# Patient Record
Sex: Female | Born: 1985
Health system: Southern US, Community
[De-identification: ages and names within clinical notes are randomized; demographics above are authoritative.]

## PROBLEM LIST (undated history)

## (undated) DIAGNOSIS — E66811 Obesity, class 1: Secondary | ICD-10-CM

## (undated) DIAGNOSIS — M25561 Pain in right knee: Secondary | ICD-10-CM

## (undated) DIAGNOSIS — I10 Essential (primary) hypertension: Secondary | ICD-10-CM

## (undated) DIAGNOSIS — E669 Obesity, unspecified: Secondary | ICD-10-CM

## (undated) HISTORY — DX: Essential (primary) hypertension: I10

## (undated) HISTORY — DX: Pain in right knee: M25.561

## (undated) HISTORY — DX: Obesity, unspecified: E66.9

## (undated) HISTORY — DX: Obesity, class 1: E66.811

---
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
## 2005-06-05 ENCOUNTER — Ambulatory Visit: Payer: Self-pay | Admitting: Sports Medicine

## 2006-07-06 ENCOUNTER — Emergency Department (HOSPITAL_COMMUNITY): Admission: EM | Admit: 2006-07-06 | Discharge: 2006-07-06 | Payer: Self-pay | Admitting: Family Medicine

## 2006-07-06 IMAGING — CR DG HAND COMPLETE 3+V*L*
3 series · 3 of 3 positions shown · non-contrast
Comparison: none

CLINICAL DATA: Hand pain

LEFT HAND - 3  VIEW:

[view not recorded (1 of 3)]
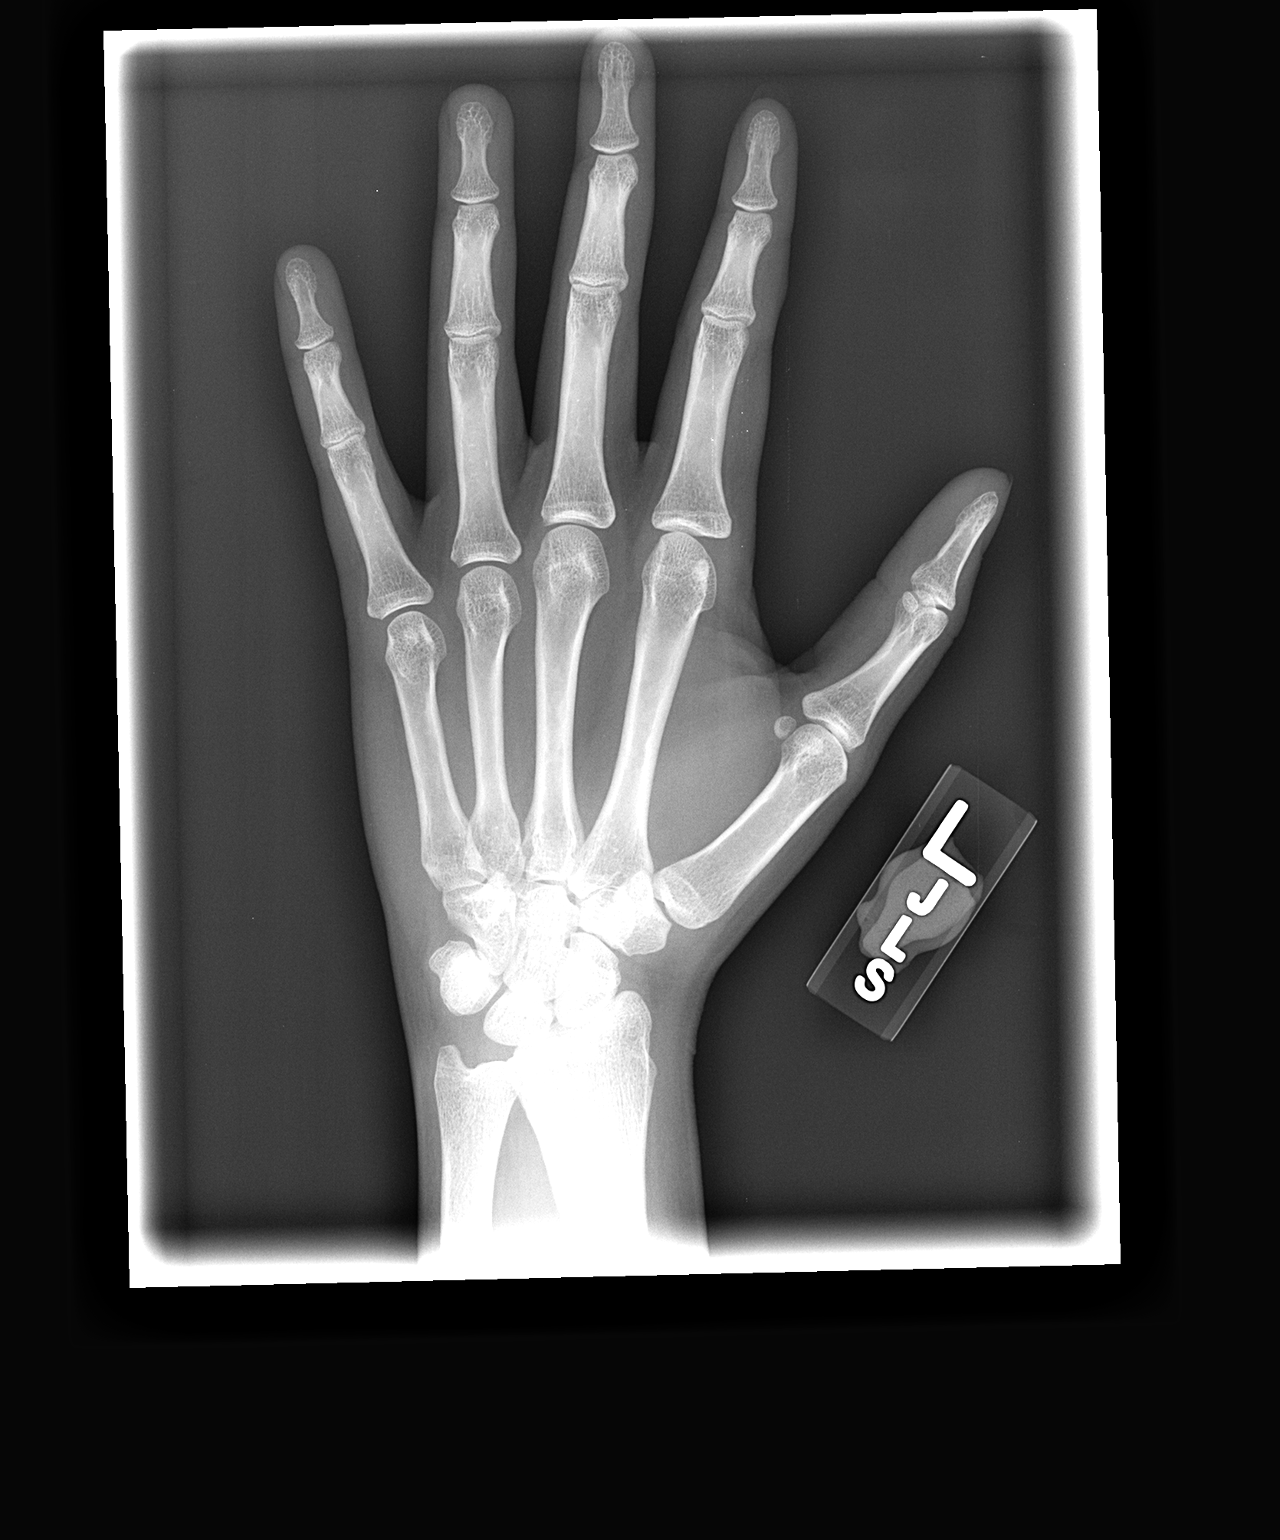

[view not recorded (2 of 3)]
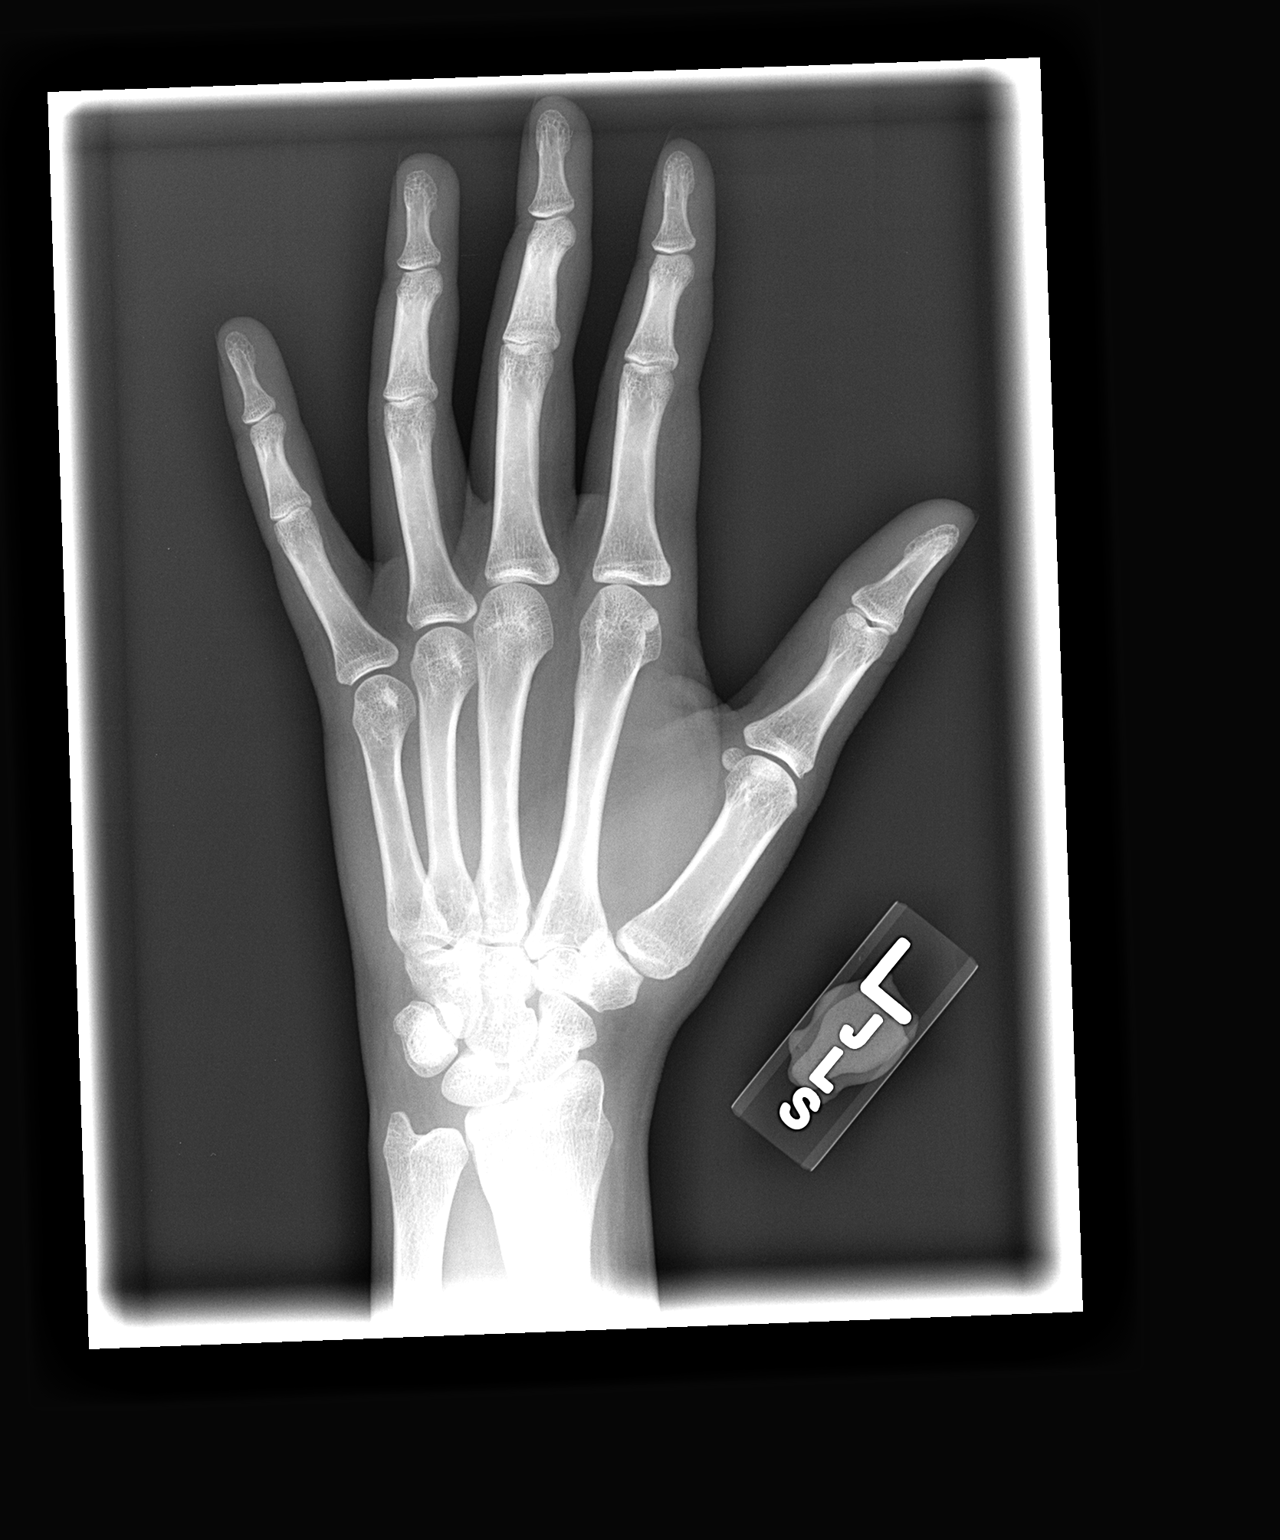

[view not recorded (3 of 3)]
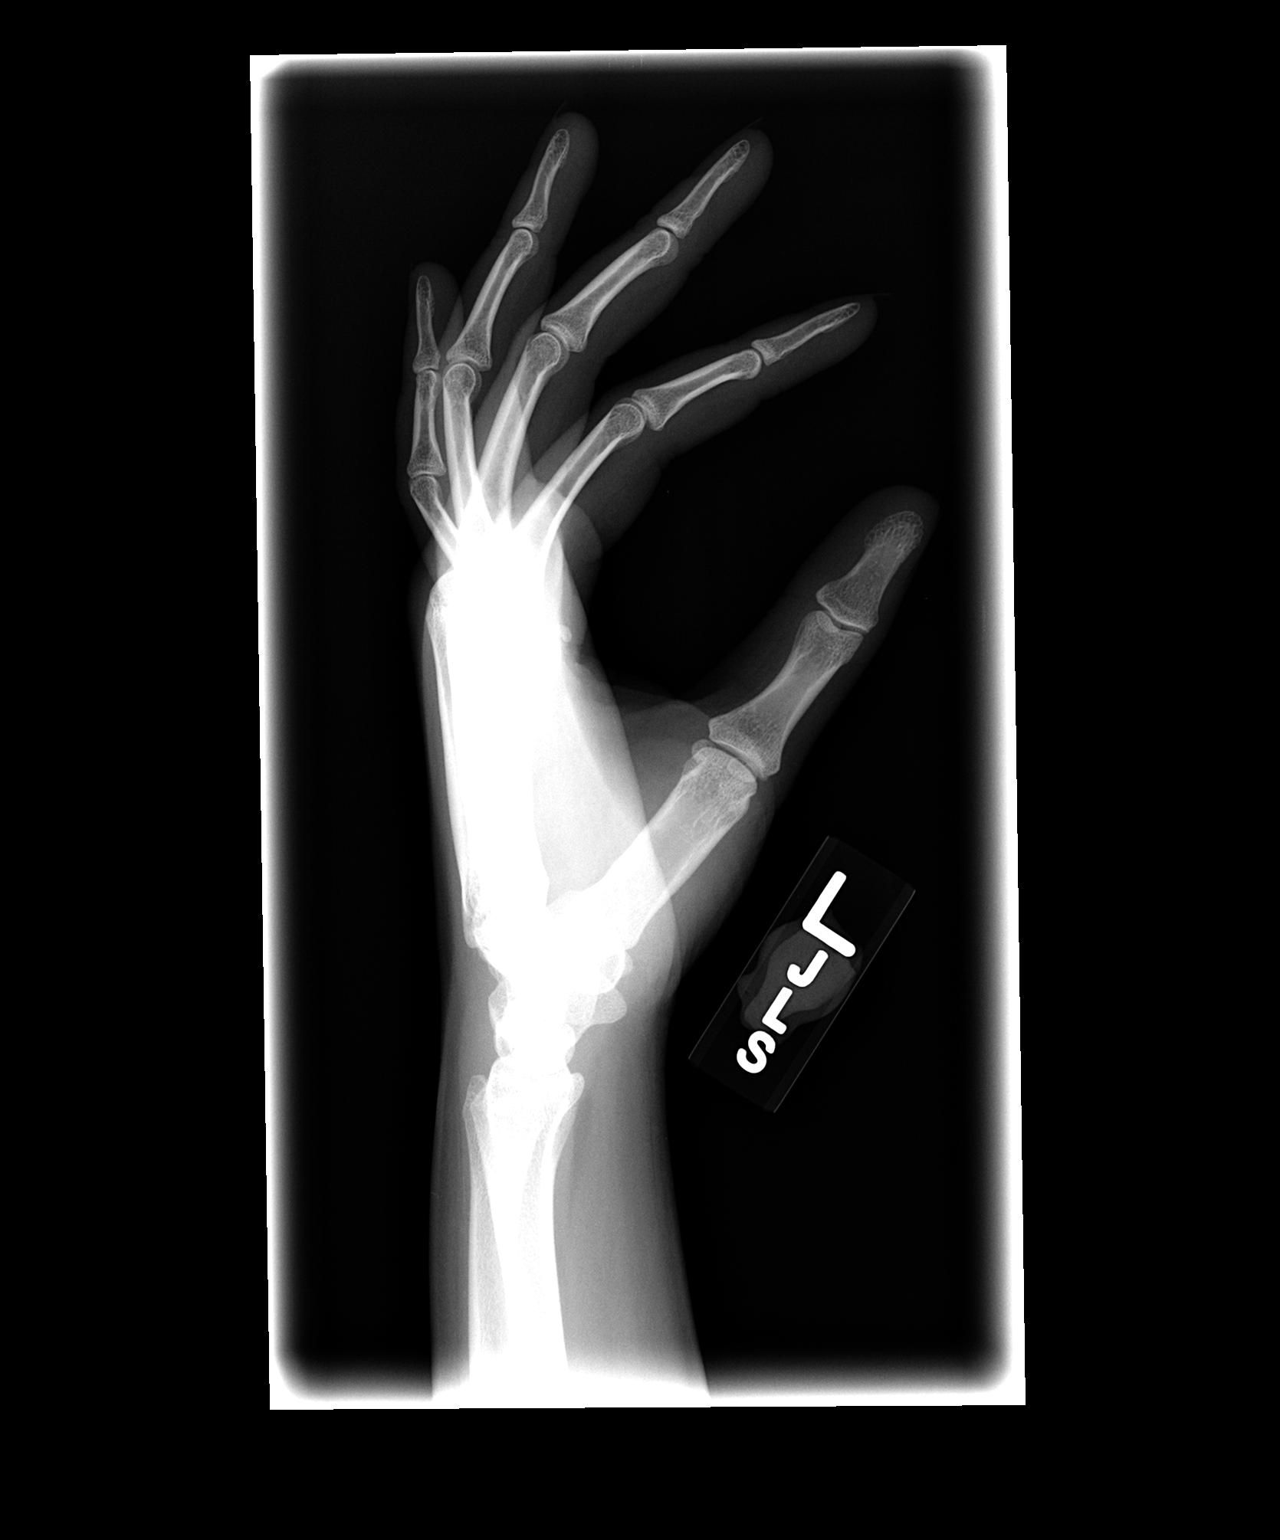

[3 of 3 positions shown; findings below may reference images not displayed]

FINDINGS: There is no evidence of fracture or dislocation.  There is no
evidence of arthropathy or other focal bone abnormality.  Soft tissues are
unremarkable.
IMPRESSION: Negative.

## 2006-07-08 ENCOUNTER — Emergency Department (HOSPITAL_COMMUNITY): Admission: EM | Admit: 2006-07-08 | Discharge: 2006-07-08 | Payer: Self-pay | Admitting: Emergency Medicine

## 2006-07-08 IMAGING — CR DG WRIST COMPLETE 3+V*L*
4 series · 4 of 4 positions shown · non-contrast
Comparison: none

CLINICAL DATA: Injury.  
 LEFT WRIST ? 4 VIEW:

[x wrist pa left]
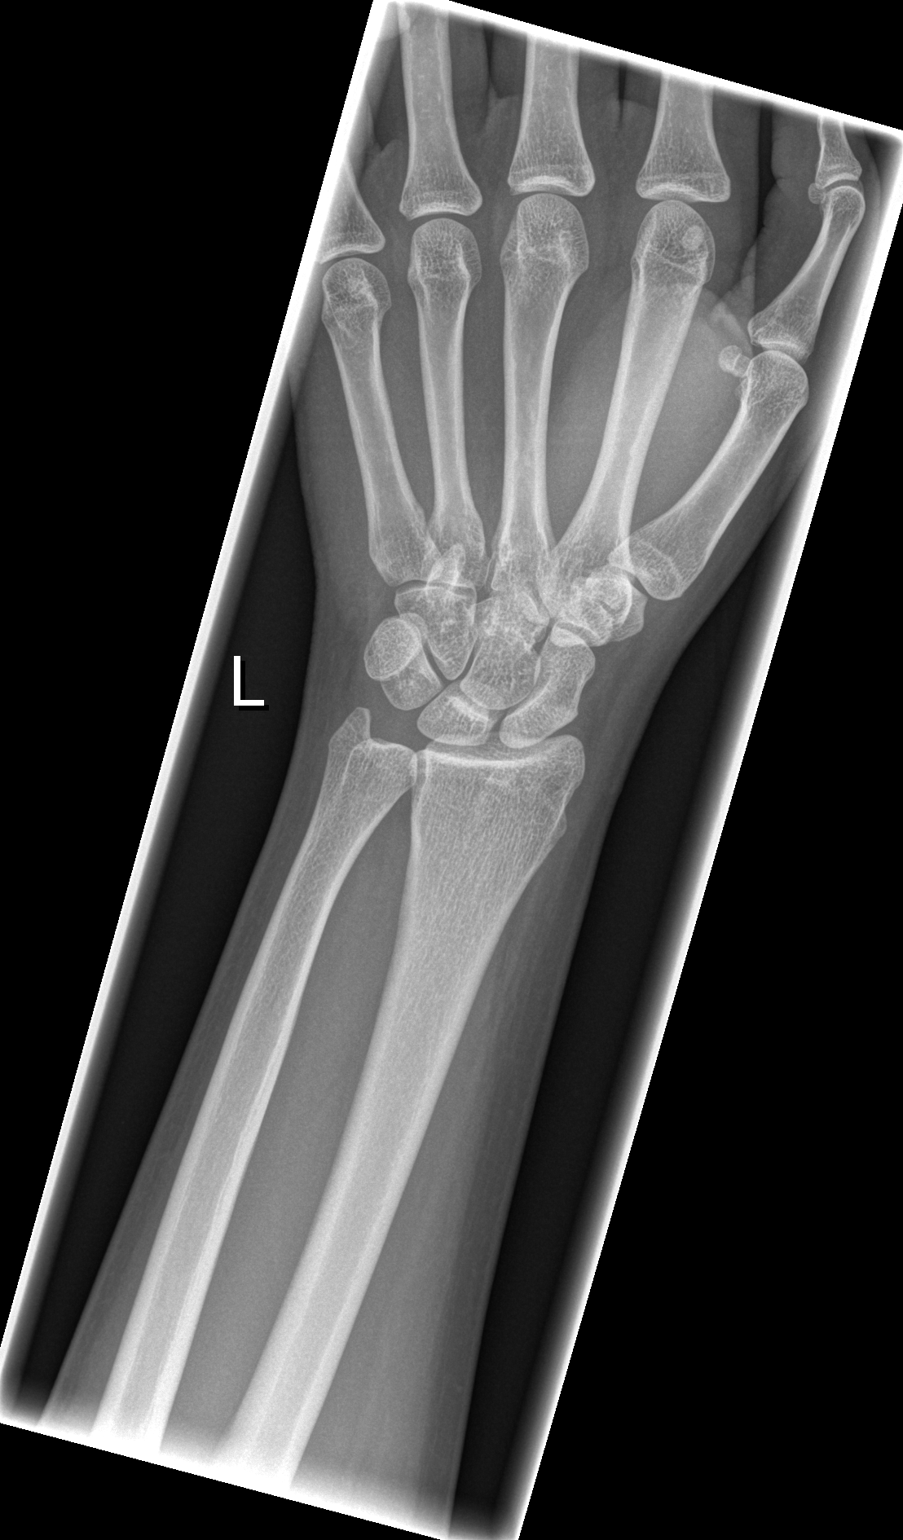

[x wrist obl left]
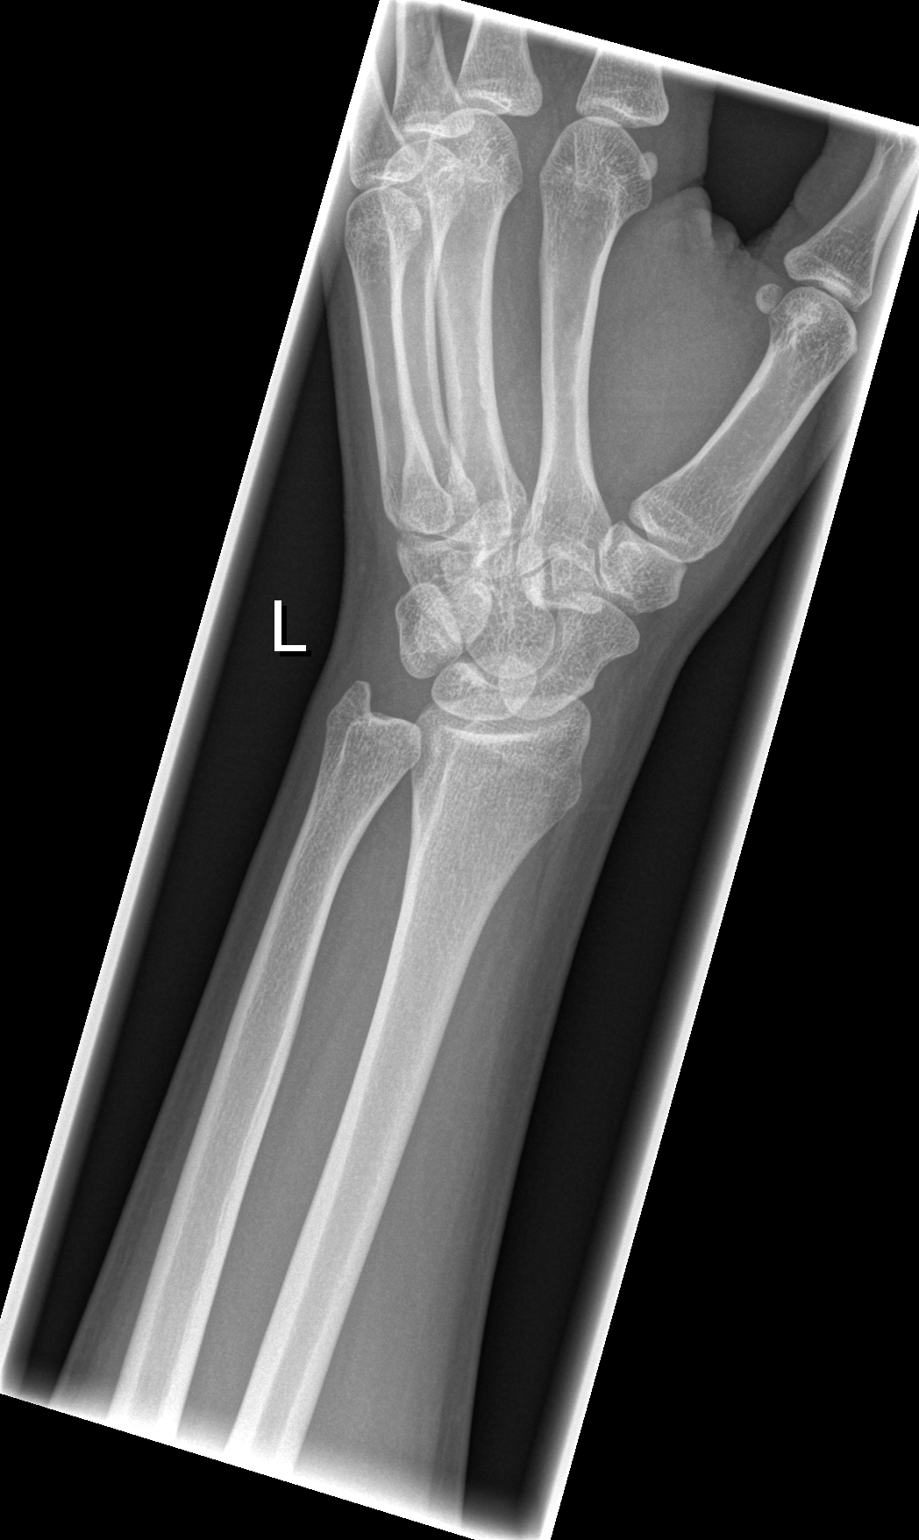

[x wrist lat left]
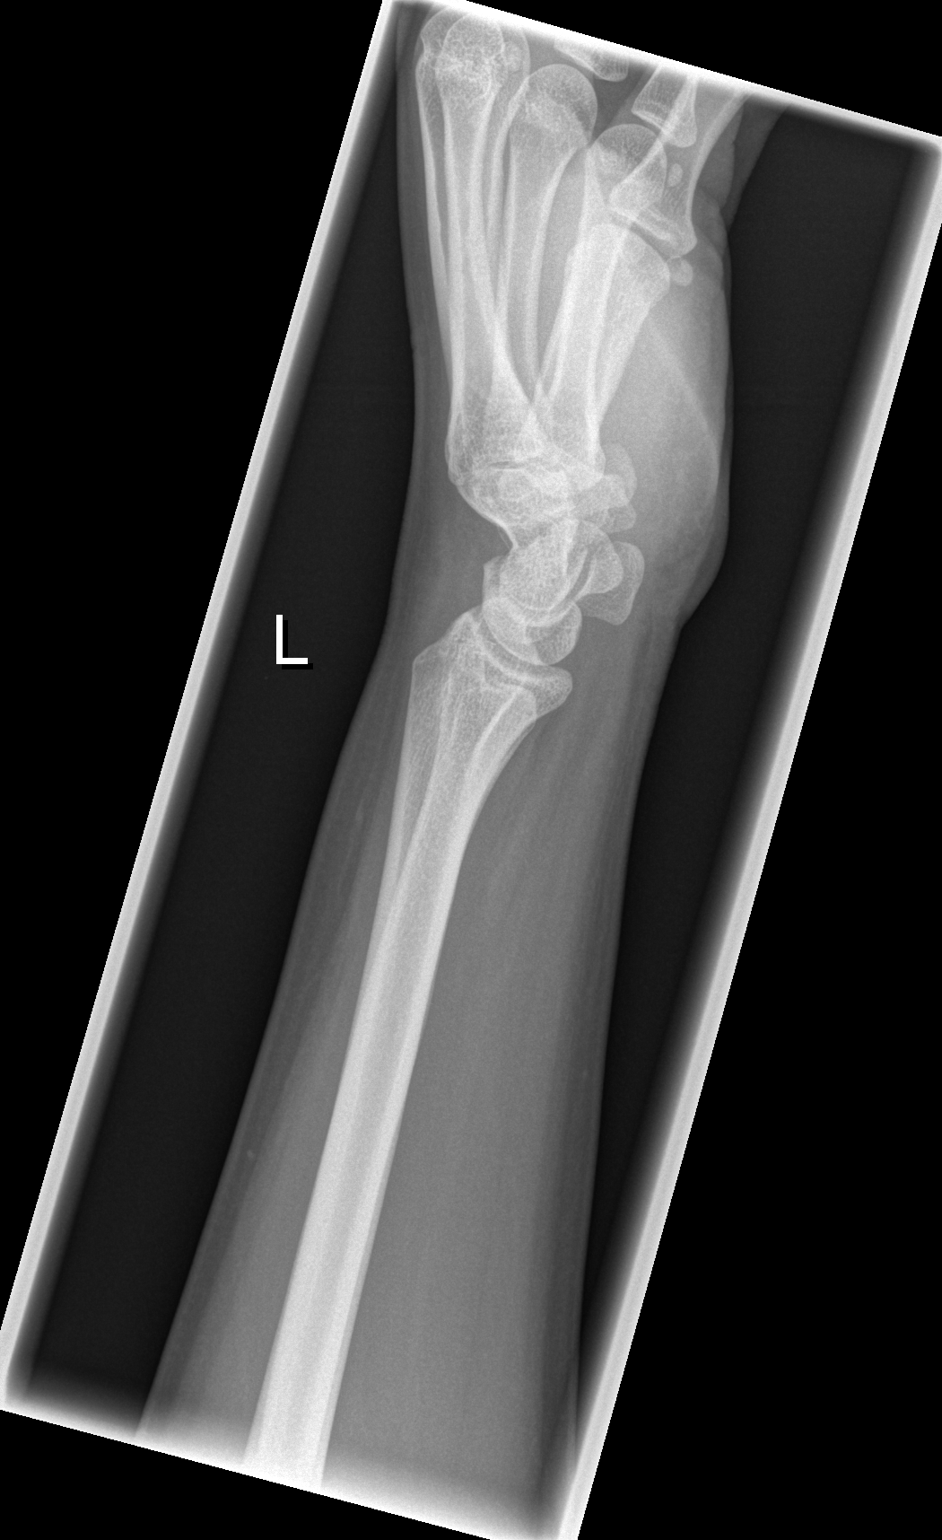

[x wrist navicular]
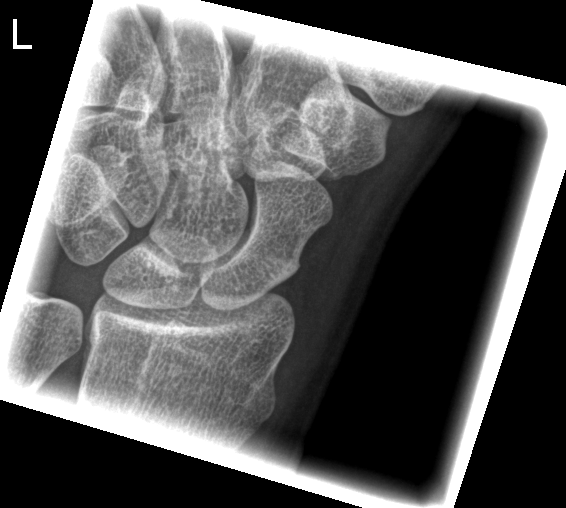

[4 of 4 positions shown; findings below may reference images not displayed]

FINDINGS: There is no evidence of fracture or dislocation.  There is no evidence of arthropathy or other focal bone abnormality.  Soft tissues are unremarkable.
IMPRESSION: Negative.

## 2008-07-15 ENCOUNTER — Ambulatory Visit: Payer: Self-pay | Admitting: Nurse Practitioner

## 2008-07-15 DIAGNOSIS — L259 Unspecified contact dermatitis, unspecified cause: Secondary | ICD-10-CM

## 2008-07-15 DIAGNOSIS — N946 Dysmenorrhea, unspecified: Secondary | ICD-10-CM

## 2008-08-22 ENCOUNTER — Encounter (INDEPENDENT_AMBULATORY_CARE_PROVIDER_SITE_OTHER): Payer: Self-pay | Admitting: Nurse Practitioner

## 2008-08-22 ENCOUNTER — Ambulatory Visit: Payer: Self-pay | Admitting: Nurse Practitioner

## 2008-08-22 LAB — CONVERTED CEMR LAB
ALT: 8 units/L (ref 0–35)
AST: 12 units/L (ref 0–37)
Albumin: 3.9 g/dL (ref 3.5–5.2)
Alkaline Phosphatase: 57 units/L (ref 39–117)
BUN: 9 mg/dL (ref 6–23)
Basophils Absolute: 0 10*3/uL (ref 0.0–0.1)
Basophils Relative: 0 % (ref 0–1)
Beta hcg, urine, semiquantitative: NEGATIVE
Bilirubin Urine: NEGATIVE
Blood in Urine, dipstick: NEGATIVE
CO2: 21 meq/L (ref 19–32)
Calcium: 9.2 mg/dL (ref 8.4–10.5)
Chlamydia, DNA Probe: NEGATIVE
Chloride: 104 meq/L (ref 96–112)
Cholesterol: 124 mg/dL (ref 0–200)
Creatinine, Ser: 0.69 mg/dL (ref 0.40–1.20)
Eosinophils Absolute: 0.1 10*3/uL (ref 0.0–0.7)
Eosinophils Relative: 2 % (ref 0–5)
GC Probe Amp, Genital: NEGATIVE
Glucose, Bld: 79 mg/dL (ref 70–99)
Glucose, Urine, Semiquant: NEGATIVE
HCT: 38.4 % (ref 36.0–46.0)
HDL: 46 mg/dL (ref >39)
Hemoglobin: 11.8 g/dL — ABNORMAL LOW (ref 12.0–15.0)
Ketones, urine, test strip: NEGATIVE
LDL Cholesterol: 71 mg/dL (ref 0–99)
Lymphocytes Relative: 40 % (ref 12–46)
Lymphs Abs: 2 10*3/uL (ref 0.7–4.0)
MCHC: 30.7 g/dL (ref 30.0–36.0)
MCV: 88.5 fL (ref 78.0–100.0)
Monocytes Absolute: 0.6 10*3/uL (ref 0.1–1.0)
Monocytes Relative: 11 % (ref 3–12)
Neutro Abs: 2.4 10*3/uL (ref 1.7–7.7)
Neutrophils Relative %: 47 % (ref 43–77)
Nitrite: NEGATIVE
Platelets: 284 10*3/uL (ref 150–400)
Potassium: 4.2 meq/L (ref 3.5–5.3)
RBC: 4.34 M/uL (ref 3.87–5.11)
RDW: 13.8 % (ref 11.5–15.5)
Sodium: 137 meq/L (ref 135–145)
Specific Gravity, Urine: 1.015
TSH: 0.376 microintl units/mL (ref 0.350–4.500)
Total Bilirubin: 0.3 mg/dL (ref 0.3–1.2)
Total CHOL/HDL Ratio: 2.7
Total Protein: 7.5 g/dL (ref 6.0–8.3)
Triglycerides: 36 mg/dL (ref <150)
Urobilinogen, UA: 0.2
VLDL: 7 mg/dL (ref 0–40)
WBC Urine, dipstick: NEGATIVE
WBC: 5 10*3/uL (ref 4.0–10.5)
pH: 7

## 2008-08-23 ENCOUNTER — Encounter (INDEPENDENT_AMBULATORY_CARE_PROVIDER_SITE_OTHER): Payer: Self-pay | Admitting: Nurse Practitioner

## 2008-11-18 ENCOUNTER — Ambulatory Visit: Payer: Self-pay | Admitting: Nurse Practitioner

## 2008-11-24 ENCOUNTER — Ambulatory Visit: Payer: Self-pay | Admitting: Nurse Practitioner

## 2008-11-25 ENCOUNTER — Ambulatory Visit: Payer: Self-pay | Admitting: Nurse Practitioner

## 2008-11-25 LAB — CONVERTED CEMR LAB: Preg, Serum: NEGATIVE

## 2009-01-23 ENCOUNTER — Telehealth (INDEPENDENT_AMBULATORY_CARE_PROVIDER_SITE_OTHER): Payer: Self-pay | Admitting: Nurse Practitioner

## 2009-02-16 ENCOUNTER — Ambulatory Visit: Payer: Self-pay | Admitting: Nurse Practitioner

## 2009-02-16 ENCOUNTER — Encounter (INDEPENDENT_AMBULATORY_CARE_PROVIDER_SITE_OTHER): Payer: Self-pay | Admitting: *Deleted

## 2009-02-17 ENCOUNTER — Telehealth (INDEPENDENT_AMBULATORY_CARE_PROVIDER_SITE_OTHER): Payer: Self-pay | Admitting: Nurse Practitioner

## 2009-03-22 ENCOUNTER — Ambulatory Visit: Payer: Self-pay | Admitting: Nurse Practitioner

## 2009-03-24 ENCOUNTER — Ambulatory Visit: Payer: Self-pay | Admitting: Nurse Practitioner

## 2009-03-28 ENCOUNTER — Telehealth (INDEPENDENT_AMBULATORY_CARE_PROVIDER_SITE_OTHER): Payer: Self-pay | Admitting: *Deleted

## 2009-03-28 ENCOUNTER — Encounter (INDEPENDENT_AMBULATORY_CARE_PROVIDER_SITE_OTHER): Payer: Self-pay | Admitting: Nurse Practitioner

## 2009-05-09 ENCOUNTER — Ambulatory Visit: Payer: Self-pay | Admitting: Nurse Practitioner

## 2009-07-31 ENCOUNTER — Ambulatory Visit: Payer: Self-pay | Admitting: Nurse Practitioner

## 2009-08-23 ENCOUNTER — Ambulatory Visit: Payer: Self-pay | Admitting: Nurse Practitioner

## 2009-08-23 DIAGNOSIS — M2550 Pain in unspecified joint: Secondary | ICD-10-CM | POA: Insufficient documentation

## 2009-08-23 LAB — CONVERTED CEMR LAB
ALT: 10 units/L (ref 0–35)
AST: 15 units/L (ref 0–37)
Albumin: 4.1 g/dL (ref 3.5–5.2)
BUN: 8 mg/dL (ref 6–23)
Bilirubin Urine: NEGATIVE
CO2: 20 meq/L (ref 19–32)
Calcium: 9.1 mg/dL (ref 8.4–10.5)
Chloride: 106 meq/L (ref 96–112)
Eosinophils Relative: 8 % — ABNORMAL HIGH (ref 0–5)
GC Probe Amp, Genital: NEGATIVE
Glucose, Urine, Semiquant: NEGATIVE
HCT: 40.3 % (ref 36.0–46.0)
Hemoglobin: 12.8 g/dL (ref 12.0–15.0)
KOH Prep: NEGATIVE
Ketones, urine, test strip: NEGATIVE
Lymphocytes Relative: 40 % (ref 12–46)
Lymphs Abs: 1.6 10*3/uL (ref 0.7–4.0)
Monocytes Relative: 7 % (ref 3–12)
Nitrite: NEGATIVE
Platelets: 219 10*3/uL (ref 150–400)
Potassium: 4.1 meq/L (ref 3.5–5.3)
Protein, U semiquant: NEGATIVE
RBC: 4.61 M/uL (ref 3.87–5.11)
Specific Gravity, Urine: >=1.03
Urobilinogen, UA: 0.2
WBC Urine, dipstick: NEGATIVE
WBC: 4 10*3/uL (ref 4.0–10.5)
pH: 5

## 2009-08-25 ENCOUNTER — Encounter (INDEPENDENT_AMBULATORY_CARE_PROVIDER_SITE_OTHER): Payer: Self-pay | Admitting: Nurse Practitioner

## 2009-10-23 ENCOUNTER — Ambulatory Visit: Payer: Self-pay | Admitting: Nurse Practitioner

## 2009-10-24 ENCOUNTER — Ambulatory Visit: Payer: Self-pay | Admitting: Nurse Practitioner

## 2010-01-03 ENCOUNTER — Emergency Department (HOSPITAL_COMMUNITY): Admission: EM | Admit: 2010-01-03 | Discharge: 2010-01-03 | Payer: Self-pay | Admitting: Family Medicine

## 2010-01-16 ENCOUNTER — Ambulatory Visit: Payer: Self-pay | Admitting: Nurse Practitioner

## 2010-04-09 ENCOUNTER — Ambulatory Visit
Admission: RE | Admit: 2010-04-09 | Discharge: 2010-04-09 | Payer: Self-pay | Source: Home / Self Care | Attending: Nurse Practitioner | Admitting: Nurse Practitioner

## 2010-04-19 NOTE — Letter (Signed)
Summary: PROOF OF PHYSICAL EXAM  PROOF OF PHYSICAL EXAM   Imported By: Arta Bruce 06/01/2009 10:40:49  _____________________________________________________________________  External Attachment:    Type:   Image     Comment:   External Document

## 2010-04-19 NOTE — Progress Notes (Signed)
Summary: Form   Phone Note Outgoing Call   Summary of Call: Form completed. make copy for chart (after pt completes her portion)  pt can take original Initial call taken by: Lehman Prom FNP,  March 28, 2009 11:45 AM  Follow-up for Phone Call        CALLED PT LET HER KNOW READY TO PICK UP Follow-up by: Arta Bruce,  March 29, 2009 11:11 AM

## 2010-04-19 NOTE — Progress Notes (Signed)
Summary: Office Visit//DEPRESSION SCREENING  Office Visit//DEPRESSION SCREENING   Imported By: Arta Bruce 08/24/2009 12:43:58  _____________________________________________________________________  External Attachment:    Type:   Image     Comment:   External Document

## 2010-04-19 NOTE — Letter (Signed)
Summary: *HSN Results Follow up  [facility]  [address] [place], [place] [address]   Phone: [phone]  Fax: [phone]      08/25/2009   Kristen Fleming [address] [place], [place]  [address]   Dear  Ms. [facility],                            ____S.Drinkard,FNP   ____D. Gore,FNP       ____B. McPherson,MD   ____V. Rankins,MD    ____E. Mulberry,MD    _X___N. Daphine Deutscher, FNP  ____D. Reche Dixon, MD    ____K. Philipp Deputy, MD    ____Other     This letter is to inform you that your recent test(s):  ___X____Pap Smear    ___X____Lab Test     _______X-ray    ___X____ is within acceptable limits  _______ requires a medication change  _______ requires a follow-up lab visit  _______ requires a follow-up visit with your provider   Comments: Labs and PAP Smear done during recent office visit normal.       _________________________________________________________ If you have any questions, please contact our office [phone].                    Sincerely,    Lehman Prom FNP [facility]

## 2010-04-19 NOTE — Letter (Signed)
Summary: PHYSICAL EXAM//STAFF QUESTIONNEER  PHYSICAL EXAM//STAFF QUESTIONNEER   Imported By: Arta Bruce 05/11/2009 12:56:23  _____________________________________________________________________  External Attachment:    Type:   Image     Comment:   External Document

## 2010-04-19 NOTE — Assessment & Plan Note (Signed)
Summary: Complete Physical Exam   Vital Signs:  Patient profile:   25 year old female Menstrual status:  regular LMP:     07/2009 Height:      65. inches Weight:      163.2 pounds BMI:     27.26 BSA:     1.82 Temp:     97.8 degrees F oral Pulse rate:   71 / minute Pulse rhythm:   regular Resp:     16 per minute BP sitting:   114 / 74  (left arm) Cuff size:   regular  Vitals Entered By: Levon Hedger (August 23, 2009 8:44 AM) CC: CPP Is Patient Diabetic? No Pain Assessment Patient in pain? no       Does patient need assistance? Functional Status Self care Ambulation Normal  Vision Screening:      Vision Comments: 05/2010 LMP (date): 07/2009 LMP - Character: heavy Menarche (age onset years): 10   Menses interval (days): 28 Menstrual flow (days): 5 Enter LMP: 07/2009 Last PAP Result  Specimen Adequacy: Satisfactory for evaluation.   Interpretation/Result:Negative for intraepithelial Lesion or Malignancy.      CC:  CPP.  History of Present Illness:  Pt into the office for a complete physical exam  PAP - done 1 year ago in this office. No history of cervical or ovarian cancer No children sexually active - not at this time Menses - irregular; currently on DepoProvera  Mammogram - no history of mammogram self breast exams at home paternal grandmother with breast cancer   Optho -  wears glasses, last eye exam 05/2009 - done at [facility]  Dental - no dental exam in over 1 year  Social - pt is currently unemployed.  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 no     Have you felt little pleasure in things? no     Depression Counseling: not indicated; screening negative for depression  Drug Use: never  Comments: PHQ- 9 score = 0  Allergies (verified): No Known Drug Allergies  Review of Systems General:  Denies fever. Eyes:  Denies blurring; last optho done in 05/2009. ENT:  Denies earache. CV:  Denies chest pain or discomfort. Resp:  Denies cough. GI:  Denies abdominal pain, nausea, and vomiting. GU:  Denies discharge. MS:  Complains of joint pain; bil arms L>R pain that starts from elbow to the wrist; some swelling noticed at home has not noticed that it is related to activity such as exercise like zumba and weight training hx of bilateral knee pain since childhood. Derm:  Denies rash. Neuro:  Denies headaches. Psych:  Denies anxiety and depression.  Physical Exam  General:  alert.   Head:  normocephalic.   Eyes:  pupils equal, pupils round, and pupils reactive to light.  glasses Ears:  left -minimal cerume - top of TM visualized right - moderate cerumen Nose:  no nasal discharge.   Mouth:  pharynx pink and moist.   Neck:  supple.   Chest Wall:  no mass.   Breasts:  skin/areolae normal and no masses.   Lungs:  normal breath sounds.   Heart:  normal rate and regular rhythm.  Abdomen:  soft, non-tender, and normal bowel sounds.   Msk:  up to the exam table active ROM - no limits Pulses:  R radial normal and L radial normal.   Extremities:  no edema Neurologic:  gait normal.    Pelvic Exam  Vulva:      normal appearance.   Urethra and Bladder:      Urethra--no discharge.   Vagina:      scant serous discharge Cervix:      normal.   Uterus:      smooth.   Adnexa:      nontender bilaterally.    Diabetes Management Exam:    Eye Exam:       Eye Exam done elsewhere          Date: 05/16/2009          Results: wears glasses          Done by: GROAT    Impression & Recommendations:  Problem # 1:  ROUTINE GYNECOLOGICAL EXAMINATION (ICD-V72.31) PHQ - 9 score = 0 PAP done labs done optho exam up to date dental hygiene clinic info sheet  given Orders: T-Comprehensive Metabolic Panel ([id number]) T-CBC w/Diff ([id number]) Rapid HIV  (92370) T-TSH [phone]) T- GC Chlamydia (95284) KOH/ WET Mount [phone]) Pap Smear, Thin Prep ( Collection of) (W1027) UA Dipstick w/o Micro (manual) (81002)  Problem # 2:  PAIN IN JOINT, MULTIPLE SITES (ICD-719.49) advised may be from overuse will check labs just in case Orders: T-Sed Rate (Automated) ([id number]) T-Antinuclear Antib (ANA) ([id number])  Complete Medication List: 1)  Naproxen 500 Mg Tabs (Naproxen) .Marland Kitchen.. 1 tablet by mouth two times a day as needed for pain 2)  Triamcinolone Acetonide 0.1 % Oint (Triamcinolone acetonide) .Marland Kitchen.. 1 application topically two times a day as needed for affected area 3)  Depo-provera 150 Mg/ml Susp (Medroxyprogesterone acetate) .... Every 3 months  Patient Instructions: 1)  joint pain - most likely from overuse 2)  will do some labs to check for inflammatory processes 3)  You will be notified of any abnormal labs 4)  Follow up as needed  Laboratory Results   Urine Tests  Date/Time Received: August 23, 2009 9:00 AM   Routine Urinalysis   Color: lt. yellow Appearance: Clear Glucose: negative   (Normal Range: Negative) Bilirubin: negative   (Normal Range: Negative) Ketone: negative   (Normal Range: Negative) Spec. Gravity: >=1.030   (Normal Range: 1.003-1.035) Blood: trace-intact   (Normal Range: Negative) pH: 5.0   (Normal Range: 5.0-8.0) Protein: negative   (Normal Range: Negative) Urobilinogen: 0.2   (Normal Range: 0-1) Nitrite: negative   (Normal Range: Negative) Leukocyte Esterace: negative   (Normal Range: Negative)    Date/Time Received: August 23, 2009 9:52 AM  Wet Mount/KOH Source: vaginal WBC/hpf: 1-5 Bacteria/hpf: rare Clue cells/hpf: none Yeast/hpf: none Trichomonas/hpf: none    Appended Document: Complete Physical Exam  Laboratory Results    Other Tests  Rapid HIV: negative

## 2010-12-03 ENCOUNTER — Other Ambulatory Visit: Payer: Self-pay | Admitting: Family Medicine

## 2019-09-07 ENCOUNTER — Other Ambulatory Visit: Payer: Self-pay

## 2019-09-08 ENCOUNTER — Encounter: Payer: Self-pay | Admitting: Internal Medicine

## 2019-09-08 ENCOUNTER — Ambulatory Visit (INDEPENDENT_AMBULATORY_CARE_PROVIDER_SITE_OTHER): Payer: No Typology Code available for payment source | Admitting: Internal Medicine

## 2019-09-08 VITALS — BP 130/84 | HR 88 | Temp 97.9°F | Ht 65.0 in | Wt 206.7 lb

## 2019-09-08 DIAGNOSIS — E669 Obesity, unspecified: Secondary | ICD-10-CM

## 2019-09-08 DIAGNOSIS — I10 Essential (primary) hypertension: Secondary | ICD-10-CM | POA: Diagnosis not present

## 2019-09-08 DIAGNOSIS — M25561 Pain in right knee: Secondary | ICD-10-CM

## 2019-09-08 DIAGNOSIS — Z124 Encounter for screening for malignant neoplasm of cervix: Secondary | ICD-10-CM | POA: Diagnosis not present

## 2019-09-08 DIAGNOSIS — G8929 Other chronic pain: Secondary | ICD-10-CM

## 2019-09-08 DIAGNOSIS — M25562 Pain in left knee: Secondary | ICD-10-CM

## 2019-09-08 NOTE — Patient Instructions (Signed)
-  Nice seeing you today!!  -Schedule follow up in 4 months for your physical. Please come in fasting that day. 

## 2019-09-08 NOTE — Progress Notes (Signed)
New Patient Office Visit     This visit occurred during the SARS-CoV-2 public health emergency.  Safety protocols were in place, including screening questions prior to the visit, additional usage of staff PPE, and extensive cleaning of exam room while observing appropriate contact time as indicated for disinfecting solutions.    CC/Reason for Visit: Establish care, discuss chronic conditions Previous PCP: None Last Visit: Unknown  HPI: Kristen Fleming is a 34 y.o. female who is coming in today for the above mentioned reasons. 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  She has mild obesity with a BMI of 34.4.  She has been getting Depo-Provera shots through the health department.  She had COVID-19 infection in April and has not yet been vaccinated.  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  Her family history is significant for a paternal grandmother with breast cancer and a maternal grandmother with hypertension and diabetes.  She has no acute complaints today.   Past Medical/Surgical History: Past Medical History:  Diagnosis Date  . Bilateral knee pain   . Hypertension   . Obesity (BMI 30.0-34.9)     History reviewed. No pertinent surgical history.  Social History:  reports that she has never smoked. She has never used smokeless tobacco. She reports current alcohol use. She reports that she does not use drugs.  Allergies: No Known Allergies  Family History:  Family History  Problem Relation Age of Onset  . Hypertension Maternal Grandmother   . Diabetes Maternal Grandmother   . Breast cancer Paternal Grandmother      Current  Outpatient Medications:  .  medroxyPROGESTERone (DEPO-PROVERA) 150 MG/ML injection, Inject 150 mg into the muscle every 3 (three) months., Disp: , Rfl:  .  hydrochlorothiazide (MICROZIDE) 12.5 MG capsule, Take 12.5 mg by mouth daily. (Patient not taking: Reported on 09/08/2019), Disp: , Rfl:   Review of Systems:  Constitutional: Denies fever, chills, diaphoresis, appetite change and fatigue.  HEENT: Denies photophobia, eye pain, redness, hearing loss, ear pain, congestion, sore throat, rhinorrhea, sneezing, mouth sores, trouble swallowing, neck pain, neck stiffness and tinnitus.   Respiratory: Denies SOB, DOE, cough, chest tightness,  and wheezing.   Cardiovascular: Denies chest pain, palpitations and leg swelling.  Gastrointestinal: Denies nausea, vomiting, abdominal pain, diarrhea, constipation, blood in stool and abdominal distention.  Genitourinary: Denies dysuria, urgency, frequency, hematuria, flank pain and difficulty urinating.  Endocrine: Denies: hot or cold intolerance, sweats, changes in hair or nails, polyuria, polydipsia. Musculoskeletal: Denies myalgias, back pain, joint swelling, arthralgias and gait problem.  Skin: Denies pallor, rash and wound.  Neurological: Denies dizziness, seizures, syncope, weakness, light-headedness, numbness and headaches.  Hematological: Denies adenopathy. Easy bruising, personal or family bleeding history  Psychiatric/Behavioral: Denies suicidal ideation, mood changes, confusion, nervousness, sleep disturbance and agitation    Physical Exam: Vitals:   09/08/19 1300  BP: 130/84  Pulse: 88  Temp: 97.9 F (36.6 C)  TempSrc: Temporal  SpO2: 99%  Weight: 206 lb 11.2 oz (93.8 kg)  Height: 5\' 5"  (1.651 m)   Body mass index is 34.4 kg/m.  Constitutional: NAD, calm, comfortable Eyes: PERRL, lids and conjunctivae normal, wears corrective lenses ENMT: Mucous membranes  are moist. Respiratory: clear to auscultation bilaterally, no wheezing, no  crackles. Normal respiratory effort. No accessory muscle use.  Cardiovascular: Regular rate and rhythm, no murmurs / rubs / gallops. No extremity edema.  Neurologic: Grossly intact and nonfocal Psychiatric: Normal judgment and insight. Alert and oriented x 3. Normal mood.    Impression and Plan:  Screening for cervical cancer  - Plan: Ambulatory referral to Gynecology  Obesity (BMI 30.0-34.9) -Discussed healthy lifestyle, including increased physical activity and better food choices to promote weight loss.  Essential hypertension -Monitor off medications for now. -Blood pressure in office today is 130/84.  Chronic pain of both knees -Continue home physical therapy as she currently is doing.    Patient Instructions  -Nice seeing you today!!  -Schedule follow up in 4 months for your physical. Please come in fasting that day.     Lelon Frohlich, MD [facility]

## 2019-09-27 ENCOUNTER — Ambulatory Visit: Payer: Self-pay | Admitting: Obstetrics and Gynecology

## 2019-09-29 ENCOUNTER — Encounter: Payer: Self-pay | Admitting: Obstetrics and Gynecology

## 2019-09-29 ENCOUNTER — Other Ambulatory Visit: Payer: Self-pay

## 2019-09-29 ENCOUNTER — Ambulatory Visit (INDEPENDENT_AMBULATORY_CARE_PROVIDER_SITE_OTHER): Payer: No Typology Code available for payment source | Admitting: Obstetrics and Gynecology

## 2019-09-29 VITALS — BP 122/78 | Ht 66.0 in | Wt 209.0 lb

## 2019-09-29 DIAGNOSIS — Z1151 Encounter for screening for human papillomavirus (HPV): Secondary | ICD-10-CM | POA: Diagnosis not present

## 2019-09-29 DIAGNOSIS — Z01419 Encounter for gynecological examination (general) (routine) without abnormal findings: Secondary | ICD-10-CM | POA: Diagnosis not present

## 2019-09-29 DIAGNOSIS — Z124 Encounter for screening for malignant neoplasm of cervix: Secondary | ICD-10-CM | POA: Diagnosis not present

## 2019-09-29 NOTE — Addendum Note (Signed)
Addended by: Dayna Barker on: 09/29/2019 03:12 PM   Modules accepted: Orders

## 2019-09-29 NOTE — Patient Instructions (Signed)
It was nice to meet you today! Everything was fine on your exam, and I will let you know the results of your Pap smear. Think about it and let me know if you would like to try anything else for birth control as we discussed at your appointment.

## 2019-09-29 NOTE — Progress Notes (Signed)
   Annella Charina Fons 1986-03-17 [id number]  SUBJECTIVE:  34 y.o. G0P0000 female new patient here for annual routine gynecologic exam and Pap smear. She has no gynecologic concerns. Amenorrheic using Depo-Provera for contraception for about 6 years, which she receives from the health department. Not currently sexually active. 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  Current Outpatient Medications  Medication Sig Dispense Refill  . medroxyPROGESTERone (DEPO-PROVERA) 150 MG/ML injection Inject 150 mg into the muscle every 3 (three) months.     No current facility-administered medications for this visit.   Allergies: Patient has no known allergies.  No LMP recorded. Patient has had an injection.  Past medical history,surgical history, problem list, medications, allergies, family history and social history were all reviewed and documented as reviewed in the EPIC chart.  ROS:  Feeling well. No dyspnea or chest pain on exertion.  No abdominal pain, change in bowel habits, black or bloody stools.  No urinary tract symptoms. GYN ROS: normal menses, no abnormal bleeding, pelvic pain or discharge, no breast pain or new or enlarging lumps on self exam. No neurological complaints.   OBJECTIVE:  BP 122/78   Ht 5\' 6"  (1.676 m)   Wt 209 lb (94.8 kg)   BMI 33.73 kg/m  The patient appears well, alert, oriented x 3, in no distress. ENT normal.  Neck supple. No cervical or supraclavicular adenopathy or thyromegaly.  Lungs are clear, good air entry, no wheezes, rhonchi or rales. S1 and S2 normal, no murmurs, regular rate and rhythm.  Abdomen soft without tenderness, guarding, mass or organomegaly.  Neurological is normal, no focal findings.  BREAST EXAM: breasts appear normal, no suspicious masses, no skin or nipple changes or axillary nodes  PELVIC EXAM: VULVA: normal appearing vulva with no masses, tenderness or lesions, VAGINA: normal appearing vagina with normal color and  discharge, no lesions, CERVIX: Long Peterson speculum required to view cervix, normal appearing cervix without discharge or lesions, UTERUS: uterus is normal size, shape, consistency and nontender, ADNEXA: normal adnexa in size, nontender and no masses, PAP: Pap smear done today, thin-prep method   Chaperone: present during the examination  ASSESSMENT:  35 y.o. G0P0000 here for annual gynecologic exam  PLAN:   1. No hormonal or menstrual concerns.  2. Pap smear 2018 (outside facility).  Denies any history of abnormal Pap smears. Pap smear/HPV test collected today. 3. Contraception. Currently using Depo-Provera, which she says she has been on about 6 years. I recommended that she think about trying alternative contraception for at least a year or two due to the bone demineralization issue with ongoing Depo-Provera use. Briefly talked about other options to include pills, patch, NuvaRing, IUDs, Nexplanon. She will think about options and let 2019 know if she has any questions, just something to think about moving forward in the next few years. 4. Breast exam normal. No family history of early onset breast cancer. Encouraged self breast awareness and yearly breast exam in the clinic. 5. Health maintenance.  No labs today as she normally has these completed elsewhere.  Return annually or sooner, prn.  [place] MD 09/29/19

## 2019-10-01 LAB — PAP IG AND HPV HIGH-RISK: HPV DNA High Risk: NOT DETECTED

## 2020-01-06 ENCOUNTER — Ambulatory Visit (INDEPENDENT_AMBULATORY_CARE_PROVIDER_SITE_OTHER): Payer: No Typology Code available for payment source | Admitting: Internal Medicine

## 2020-01-06 ENCOUNTER — Other Ambulatory Visit: Payer: Self-pay

## 2020-01-06 ENCOUNTER — Encounter: Payer: Self-pay | Admitting: Internal Medicine

## 2020-01-06 VITALS — BP 130/80 | HR 74 | Temp 98.1°F | Ht 65.5 in | Wt 215.7 lb

## 2020-01-06 DIAGNOSIS — E669 Obesity, unspecified: Secondary | ICD-10-CM

## 2020-01-06 DIAGNOSIS — I10 Essential (primary) hypertension: Secondary | ICD-10-CM | POA: Diagnosis not present

## 2020-01-06 DIAGNOSIS — Z0000 Encounter for general adult medical examination without abnormal findings: Secondary | ICD-10-CM | POA: Diagnosis not present

## 2020-01-06 LAB — LIPID PANEL
Cholesterol: 138 mg/dL (ref <200)
HDL: 49 mg/dL — ABNORMAL LOW (ref >=50)
LDL Cholesterol (Calc): 78 mg/dL (calc)
Non-HDL Cholesterol (Calc): 89 mg/dL (calc) (ref <130)
Total CHOL/HDL Ratio: 2.8 (calc) (ref <5.0)
Triglycerides: 39 mg/dL (ref <150)

## 2020-01-06 LAB — COMPREHENSIVE METABOLIC PANEL
AG Ratio: 1.4 (calc) (ref 1.0–2.5)
ALT: 14 U/L (ref 6–29)
AST: 16 U/L (ref 10–30)
Albumin: 4 g/dL (ref 3.6–5.1)
Alkaline phosphatase (APISO): 64 U/L (ref 31–125)
BUN: 11 mg/dL (ref 7–25)
CO2: 25 mmol/L (ref 20–32)
Calcium: 9.1 mg/dL (ref 8.6–10.2)
Chloride: 105 mmol/L (ref 98–110)
Creat: 0.82 mg/dL (ref 0.50–1.10)
Globulin: 2.8 g/dL (calc) (ref 1.9–3.7)
Glucose, Bld: 97 mg/dL (ref 65–99)
Potassium: 4.2 mmol/L (ref 3.5–5.3)
Sodium: 138 mmol/L (ref 135–146)
Total Bilirubin: 0.4 mg/dL (ref 0.2–1.2)
Total Protein: 6.8 g/dL (ref 6.1–8.1)

## 2020-01-06 LAB — CBC WITH DIFFERENTIAL/PLATELET
Absolute Monocytes: 342 cells/uL (ref 200–950)
Basophils Absolute: 41 cells/uL (ref 0–200)
Basophils Relative: 0.8 %
Eosinophils Absolute: 403 cells/uL (ref 15–500)
Eosinophils Relative: 7.9 %
HCT: 40 % (ref 35.0–45.0)
Hemoglobin: 13.2 g/dL (ref 11.7–15.5)
Lymphs Abs: 2055 cells/uL (ref 850–3900)
MCH: 28.6 pg (ref 27.0–33.0)
MCHC: 33 g/dL (ref 32.0–36.0)
MCV: 86.8 fL (ref 80.0–100.0)
MPV: 10.4 fL (ref 7.5–12.5)
Monocytes Relative: 6.7 %
Neutro Abs: 2259 cells/uL (ref 1500–7800)
Neutrophils Relative %: 44.3 %
Platelets: 232 10*3/uL (ref 140–400)
RBC: 4.61 10*6/uL (ref 3.80–5.10)
RDW: 12.9 % (ref 11.0–15.0)
Total Lymphocyte: 40.3 %
WBC: 5.1 10*3/uL (ref 3.8–10.8)

## 2020-01-06 NOTE — Progress Notes (Signed)
Established Patient Office Visit     This visit occurred during the SARS-CoV-2 public health emergency.  Safety protocols were in place, including screening questions prior to the visit, additional usage of staff PPE, and extensive cleaning of exam room while observing appropriate contact time as indicated for disinfecting solutions.    CC/Reason for Visit: Annual preventive exam  HPI: Kristen Fleming is a 34 y.o. female who is coming in today for the above mentioned reasons. Past Medical History is significant for: Mild obesity as well as history of hypertension who is currently being monitored off medication.  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  She is average risk for cancer, so we will assume cancer screening at age-appropriate intervals.  She had a Pap smear in July with her GYN.  She has no acute complaints today.  She has had her flu and Covid vaccines.  We do not have documentation of her most recent Tdap.   Past Medical/Surgical History: Past Medical History:  Diagnosis Date  . Bilateral knee pain   . Hypertension   . Obesity (BMI 30.0-34.9)     No past surgical history on file.  Social History:  reports that she has never smoked. She has never used smokeless tobacco. She reports current alcohol use. She reports that she does not use drugs.  Allergies: No Known Allergies  Family History:  Family History  Problem Relation Age of Onset  . Hypertension Maternal Grandmother   . Diabetes Maternal Grandmother   . Breast cancer Paternal Grandmother 67    No current outpatient medications on file.  Review of Systems:  Constitutional: Denies fever, chills, diaphoresis, appetite change and fatigue.  HEENT: Denies photophobia, eye pain, redness, hearing loss, ear pain, congestion, sore throat, rhinorrhea, sneezing, mouth sores, trouble swallowing, neck pain, neck stiffness and tinnitus.   Respiratory: Denies SOB, DOE, cough, chest tightness,  and wheezing.     Cardiovascular: Denies chest pain, palpitations and leg swelling.  Gastrointestinal: Denies nausea, vomiting, abdominal pain, diarrhea, constipation, blood in stool and abdominal distention.  Genitourinary: Denies dysuria, urgency, frequency, hematuria, flank pain and difficulty urinating.  Endocrine: Denies: hot or cold intolerance, sweats, changes in hair or nails, polyuria, polydipsia. Musculoskeletal: Denies myalgias, back pain, joint swelling, arthralgias and gait problem.  Skin: Denies pallor, rash and wound.  Neurological: Denies dizziness, seizures, syncope, weakness, light-headedness, numbness and headaches.  Hematological: Denies adenopathy. Easy bruising, personal or family bleeding history  Psychiatric/Behavioral: Denies suicidal ideation, mood changes, confusion, nervousness, sleep disturbance and agitation    Physical Exam: Vitals:   01/06/20 0800  BP: 130/80  Pulse: 74  Temp: 98.1 F (36.7 C)  TempSrc: Oral  SpO2: 98%  Weight: 215 lb 11.2 oz (97.8 kg)  Height: 5' 5.5" (1.664 m)    Body mass index is 35.35 kg/m.   Constitutional: NAD, calm, comfortable Eyes: PERRL, lids and conjunctivae normal, wears corrective lenses ENMT: Mucous membranes are moist. Posterior pharynx clear of any exudate or lesions. Normal dentition. Tympanic membrane is pearly white, no erythema or bulging. Neck: normal, supple, no masses, no thyromegaly Respiratory: clear to auscultation bilaterally, no wheezing, no crackles. Normal respiratory effort. No accessory muscle use.  Cardiovascular: Regular rate and rhythm, no murmurs / rubs / gallops. No extremity edema. 2+ pedal pulses. No carotid bruits.  Abdomen: no tenderness, no masses palpated. No hepatosplenomegaly. Bowel sounds positive.  Musculoskeletal: no clubbing / cyanosis. No joint deformity upper and lower extremities. Good ROM, no contractures. Normal muscle tone.  Skin: no rashes, lesions, ulcers. No induration Neurologic: CN 2-12  grossly intact. Sensation intact, DTR normal. Strength 5/5 in all 4.  Psychiatric: Normal judgment and insight. Alert and oriented x 3. Normal mood.    Impression and Plan:  Encounter for preventive health examination -She has routine eye and dental care. -She will call: Employee health and let us know about the status of her most recent Tdap vaccination, otherwise immunizations are up-to-date. -Screening labs today. -Healthy lifestyle discussed in detail. -She had a Pap smear over the summer, she will follow-up with GYN for this but screening interval is usually every 3 to 5 years. -Commence routine breast cancer screening age 16. -Commence routine colon cancer screening at age 29.  Obesity (BMI 30.0-34.9)  -Discussed healthy lifestyle, including increased physical activity and better food choices to promote weight loss.  Primary hypertension  -She is being monitored off medication for now. -She will notify us if she has any values above 130/80.    Patient Instructions  -Nice seeing you today!!  -Lab work today; will notify you once results are available.  -Find out when your last Tdap vaccine was and let us know.  -Please monitor your blood pressure and let us know if you consistently see values above 130/80.  -Schedule follow up in 1 year or sooner as needed.   Preventive Care 48-91 Years Old, Female Preventive care refers to visits with your health care provider and lifestyle choices that can promote health and wellness. This includes:  A yearly physical exam. This may also be called an annual well check.  Regular dental visits and eye exams.  Immunizations.  Screening for certain conditions.  Healthy lifestyle choices, such as eating a healthy diet, getting regular exercise, not using drugs or products that contain nicotine and tobacco, and limiting alcohol use. What can I expect for my preventive care visit? Physical exam Your health care provider will check  your:  Height and weight. This may be used to calculate body mass index (BMI), which tells if you are at a healthy weight.  Heart rate and blood pressure.  Skin for abnormal spots. Counseling Your health care provider may ask you questions about your:  Alcohol, tobacco, and drug use.  Emotional well-being.  Home and relationship well-being.  Sexual activity.  Eating habits.  Work and work Statistician.  Method of birth control.  Menstrual cycle.  Pregnancy history. What immunizations do I need?  Influenza (flu) vaccine  This is recommended every year. Tetanus, diphtheria, and pertussis (Tdap) vaccine  You may need a Td booster every 10 years. Varicella (chickenpox) vaccine  You may need this if you have not been vaccinated. Human papillomavirus (HPV) vaccine  If recommended by your health care provider, you may need three doses over 6 months. Measles, mumps, and rubella (MMR) vaccine  You may need at least one dose of MMR. You may also need a second dose. Meningococcal conjugate (MenACWY) vaccine  One dose is recommended if you are age 43-21 years and a first-year college student living in a residence hall, or if you have one of several medical conditions. You may also need additional booster doses. Pneumococcal conjugate (PCV13) vaccine  You may need this if you have certain conditions and were not previously vaccinated. Pneumococcal polysaccharide (PPSV23) vaccine  You may need one or two doses if you smoke cigarettes or if you have certain conditions. Hepatitis A vaccine  You may need this if you have certain conditions or if you travel  or work in places where you may be exposed to hepatitis A. Hepatitis B vaccine  You may need this if you have certain conditions or if you travel or work in places where you may be exposed to hepatitis B. Haemophilus influenzae type b (Hib) vaccine  You may need this if you have certain conditions. You may receive  vaccines as individual doses or as more than one vaccine together in one shot (combination vaccines). Talk with your health care provider about the risks and benefits of combination vaccines. What tests do I need?  Blood tests  Lipid and cholesterol levels. These may be checked every 5 years starting at age 66.  Hepatitis C test.  Hepatitis B test. Screening  Diabetes screening. This is done by checking your blood sugar (glucose) after you have not eaten for a while (fasting).  Sexually transmitted disease (STD) testing.  BRCA-related cancer screening. This may be done if you have a family history of breast, ovarian, tubal, or peritoneal cancers.  Pelvic exam and Pap test. This may be done every 3 years starting at age 42. Starting at age 46, this may be done every 5 years if you have a Pap test in combination with an HPV test. Talk with your health care provider about your test results, treatment options, and if necessary, the need for more tests. Follow these instructions at home: Eating and drinking   Eat a diet that includes fresh fruits and vegetables, whole grains, lean protein, and low-fat dairy.  Take vitamin and mineral supplements as recommended by your health care provider.  Do not drink alcohol if: ? Your health care provider tells you not to drink. ? You are pregnant, may be pregnant, or are planning to become pregnant.  If you drink alcohol: ? Limit how much you have to 0-1 drink a day. ? Be aware of how much alcohol is in your drink. In the [place] one drink equals one 12 oz bottle of beer (355 mL), one 5 oz glass of wine (148 mL), or one 1 oz glass of hard liquor (44 mL). Lifestyle  Take daily care of your teeth and gums.  Stay active. Exercise for at least 30 minutes on 5 or more days each week.  Do not use any products that contain nicotine or tobacco, such as cigarettes, e-cigarettes, and chewing tobacco. If you need help quitting, ask your health care  provider.  If you are sexually active, practice safe sex. Use a condom or other form of birth control (contraception) in order to prevent pregnancy and STIs (sexually transmitted infections). If you plan to become pregnant, see your health care provider for a preconception visit. What's next?  Visit your health care provider once a year for a well check visit.  Ask your health care provider how often you should have your eyes and teeth checked.  Stay up to date on all vaccines. This information is not intended to replace advice given to you by your health care provider. Make sure you discuss any questions you have with your health care provider. Document Revised: 11/13/2017 Document Reviewed: 11/13/2017 Elsevier Patient Education  2020 [place], MD [facility]

## 2020-01-06 NOTE — Addendum Note (Signed)
Addended by: Lerry Liner on: 01/06/2020 08:30 AM   Modules accepted: Orders

## 2020-01-06 NOTE — Patient Instructions (Signed)
-Nice seeing you today!!  -Lab work today; will notify you once results are available.  -Find out when your last Tdap vaccine was and let us know.  -Please monitor your blood pressure and let us know if you consistently see values above 130/80.  -Schedule follow up in 1 year or sooner as needed.   Preventive Care 37-34 Years Old, Female Preventive care refers to visits with your health care provider and lifestyle choices that can promote health and wellness. This includes:  A yearly physical exam. This may also be called an annual well check.  Regular dental visits and eye exams.  Immunizations.  Screening for certain conditions.  Healthy lifestyle choices, such as eating a healthy diet, getting regular exercise, not using drugs or products that contain nicotine and tobacco, and limiting alcohol use. What can I expect for my preventive care visit? Physical exam Your health care provider will check your:  Height and weight. This may be used to calculate body mass index (BMI), which tells if you are at a healthy weight.  Heart rate and blood pressure.  Skin for abnormal spots. Counseling Your health care provider may ask you questions about your:  Alcohol, tobacco, and drug use.  Emotional well-being.  Home and relationship well-being.  Sexual activity.  Eating habits.  Work and work Statistician.  Method of birth control.  Menstrual cycle.  Pregnancy history. What immunizations do I need?  Influenza (flu) vaccine  This is recommended every year. Tetanus, diphtheria, and pertussis (Tdap) vaccine  You may need a Td booster every 10 years. Varicella (chickenpox) vaccine  You may need this if you have not been vaccinated. Human papillomavirus (HPV) vaccine  If recommended by your health care provider, you may need three doses over 6 months. Measles, mumps, and rubella (MMR) vaccine  You may need at least one dose of MMR. You may also need a second  dose. Meningococcal conjugate (MenACWY) vaccine  One dose is recommended if you are age 21-21 years and a first-year college student living in a residence hall, or if you have one of several medical conditions. You may also need additional booster doses. Pneumococcal conjugate (PCV13) vaccine  You may need this if you have certain conditions and were not previously vaccinated. Pneumococcal polysaccharide (PPSV23) vaccine  You may need one or two doses if you smoke cigarettes or if you have certain conditions. Hepatitis A vaccine  You may need this if you have certain conditions or if you travel or work in places where you may be exposed to hepatitis A. Hepatitis B vaccine  You may need this if you have certain conditions or if you travel or work in places where you may be exposed to hepatitis B. Haemophilus influenzae type b (Hib) vaccine  You may need this if you have certain conditions. You may receive vaccines as individual doses or as more than one vaccine together in one shot (combination vaccines). Talk with your health care provider about the risks and benefits of combination vaccines. What tests do I need?  Blood tests  Lipid and cholesterol levels. These may be checked every 5 years starting at age 76.  Hepatitis C test.  Hepatitis B test. Screening  Diabetes screening. This is done by checking your blood sugar (glucose) after you have not eaten for a while (fasting).  Sexually transmitted disease (STD) testing.  BRCA-related cancer screening. This may be done if you have a family history of breast, ovarian, tubal, or peritoneal cancers.  Pelvic  exam and Pap test. This may be done every 3 years starting at age 52. Starting at age 90, this may be done every 5 years if you have a Pap test in combination with an HPV test. Talk with your health care provider about your test results, treatment options, and if necessary, the need for more tests. Follow these instructions at  home: Eating and drinking   Eat a diet that includes fresh fruits and vegetables, whole grains, lean protein, and low-fat dairy.  Take vitamin and mineral supplements as recommended by your health care provider.  Do not drink alcohol if: ? Your health care provider tells you not to drink. ? You are pregnant, may be pregnant, or are planning to become pregnant.  If you drink alcohol: ? Limit how much you have to 0-1 drink a day. ? Be aware of how much alcohol is in your drink. In the [place] one drink equals one 12 oz bottle of beer (355 mL), one 5 oz glass of wine (148 mL), or one 1 oz glass of hard liquor (44 mL). Lifestyle  Take daily care of your teeth and gums.  Stay active. Exercise for at least 30 minutes on 5 or more days each week.  Do not use any products that contain nicotine or tobacco, such as cigarettes, e-cigarettes, and chewing tobacco. If you need help quitting, ask your health care provider.  If you are sexually active, practice safe sex. Use a condom or other form of birth control (contraception) in order to prevent pregnancy and STIs (sexually transmitted infections). If you plan to become pregnant, see your health care provider for a preconception visit. What's next?  Visit your health care provider once a year for a well check visit.  Ask your health care provider how often you should have your eyes and teeth checked.  Stay up to date on all vaccines. This information is not intended to replace advice given to you by your health care provider. Make sure you discuss any questions you have with your health care provider. Document Revised: 11/13/2017 Document Reviewed: 11/13/2017 Elsevier Patient Education  2020 [employer].

## 2020-03-22 ENCOUNTER — Telehealth: Payer: Self-pay | Admitting: Internal Medicine

## 2020-03-22 NOTE — Telephone Encounter (Signed)
Staff Health Assessment/Medical Report to be filled out--placed in dr's folder.  Call [phone] upon completion.

## 2020-04-19 ENCOUNTER — Encounter: Payer: Self-pay | Admitting: Obstetrics and Gynecology

## 2020-04-19 ENCOUNTER — Other Ambulatory Visit: Payer: Self-pay

## 2020-04-19 ENCOUNTER — Ambulatory Visit (INDEPENDENT_AMBULATORY_CARE_PROVIDER_SITE_OTHER): Payer: No Typology Code available for payment source | Admitting: Obstetrics and Gynecology

## 2020-04-19 VITALS — BP 130/82 | HR 68 | Resp 20

## 2020-04-19 DIAGNOSIS — Z30017 Encounter for initial prescription of implantable subdermal contraceptive: Secondary | ICD-10-CM | POA: Diagnosis not present

## 2020-04-19 NOTE — Progress Notes (Signed)
   Kristen Fleming [birth year] [id number]  SUBJECTIVE:  35 y.o. G0P0000 female presents for Nexplanon insertion.  She is toward the end of her menstrual period with LMP 04/16/2020.  She has been without contraception since her last Depo-Provera injection last summer per her recollection.  After reviewing the various contraceptive options she with the patient and counseling regarding pros and cons of the various different hormonal and nonhormonal birth control methods, the patient decided to have a Nexplanon placed.  She was counseled regarding the risks of infection, hematoma and bruising, and nerve disruption in the upper arm.  There can be problems with the device migrating deeper into the tissues requiring surgical removal.  Side effects including menstrual bleeding irregularities and/or changes, mood disorder, weight gain, headache, acne, depression, and ovarian cysts were all reviewed with the patient.  Less common side effects including nausea and dizziness breast pain, abdominal pain were also reviewed.  She signed a written consent and was comfortable with proceeding with the Nexplanon insertion today.  Recommended backup contraception for 1 week if sexually active before relying on Nexplanon for contraception.    OBJECTIVE:  BP 130/82 (BP Location: Right Arm, Patient Position: Sitting)   Pulse 68   Resp 20   LMP 04/16/2020 (Exact Date)   Procedure Nexplanon insertion The planned Nexplanon device insertion site was marked according to manufacturer's instructions 10 cm proximal to the medial epicondyle of the humerus on the inner side of the left arm.  The site was cleansed with Betadine swab.  2.5 mL of 1% lidocaine was injected just beneath the skin along the planned insertion tunnel.  A change to sterile gloves was made.  Maintaining sterility, the Nexplanon applicator needle was used to puncture the skin with the tip of the needle angled slightly less than 30 and the applicator was  lowered to horizontal position while lifting the skin at the tip of the needle to slide the needle to its full length.  The slider was moved back fully and the implant was placed into its final subdermal position.  The presence of the implant was verified by palpation by the patient and myself.  The site was then dressed in a normal fashion.  The patient tolerated the procedure well.  Lot #[id number], expiration 06/25/2022  [name] RN assisting for the procedure    Theresia Majors MD 04/19/20

## 2020-04-27 ENCOUNTER — Ambulatory Visit: Payer: No Typology Code available for payment source | Admitting: Obstetrics and Gynecology

## 2020-05-05 ENCOUNTER — Encounter: Payer: Self-pay | Admitting: Anesthesiology

## 2020-09-14 ENCOUNTER — Encounter (HOSPITAL_COMMUNITY): Payer: Self-pay

## 2020-09-14 ENCOUNTER — Ambulatory Visit (HOSPITAL_COMMUNITY)
Admission: EM | Admit: 2020-09-14 | Discharge: 2020-09-14 | Disposition: A | Discharge status: Home / Self Care | Payer: No Typology Code available for payment source | Attending: Emergency Medicine | Admitting: Emergency Medicine

## 2020-09-14 ENCOUNTER — Other Ambulatory Visit: Payer: Self-pay

## 2020-09-14 ENCOUNTER — Ambulatory Visit (INDEPENDENT_AMBULATORY_CARE_PROVIDER_SITE_OTHER): Payer: No Typology Code available for payment source

## 2020-09-14 DIAGNOSIS — M79671 Pain in right foot: Secondary | ICD-10-CM

## 2020-09-14 DIAGNOSIS — S93601A Unspecified sprain of right foot, initial encounter: Secondary | ICD-10-CM | POA: Diagnosis not present

## 2020-09-14 DIAGNOSIS — W19XXXA Unspecified fall, initial encounter: Secondary | ICD-10-CM

## 2020-09-14 IMAGING — DX DG FOOT COMPLETE 3+V*R*
3 series · 3 of 3 positions shown · non-contrast
Comparison: None.

CLINICAL DATA: 35-year-old female with fall and trauma to the right
foot.

EXAM:
RIGHT FOOT COMPLETE - 3+ VIEW

[foot ap]
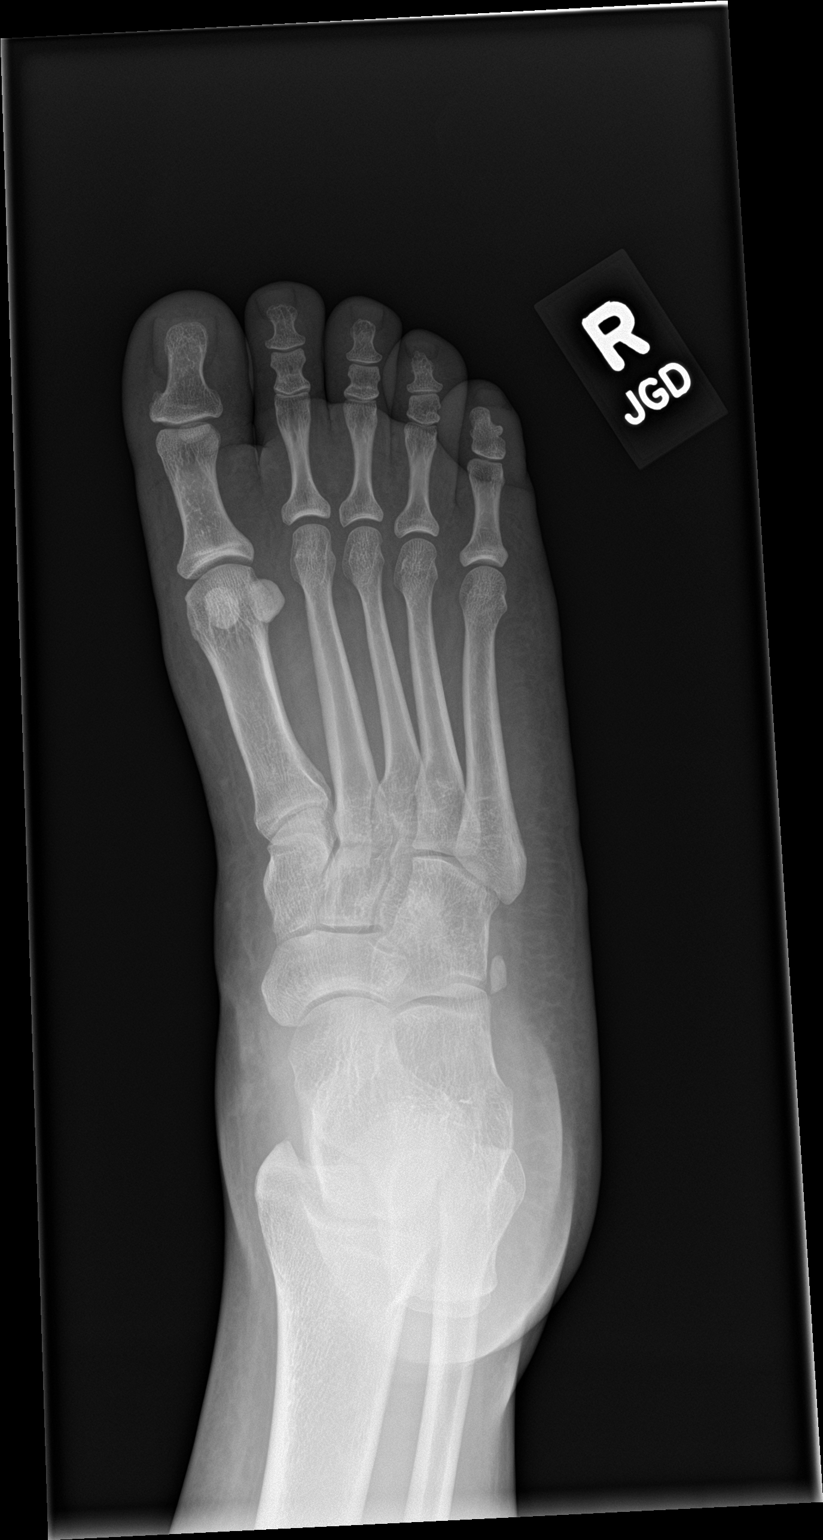

[foot obl]
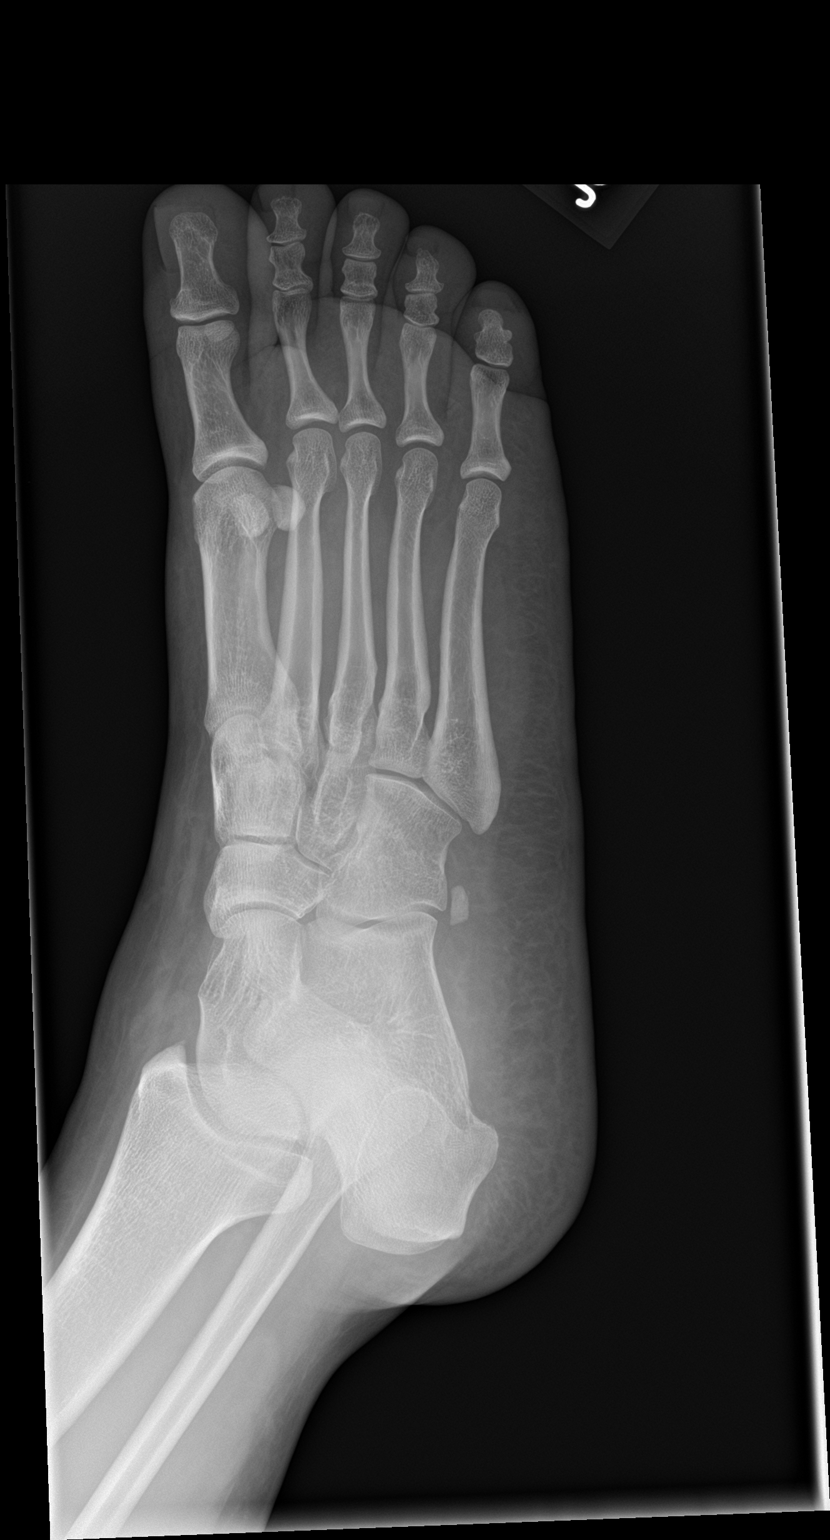

[foot lat]
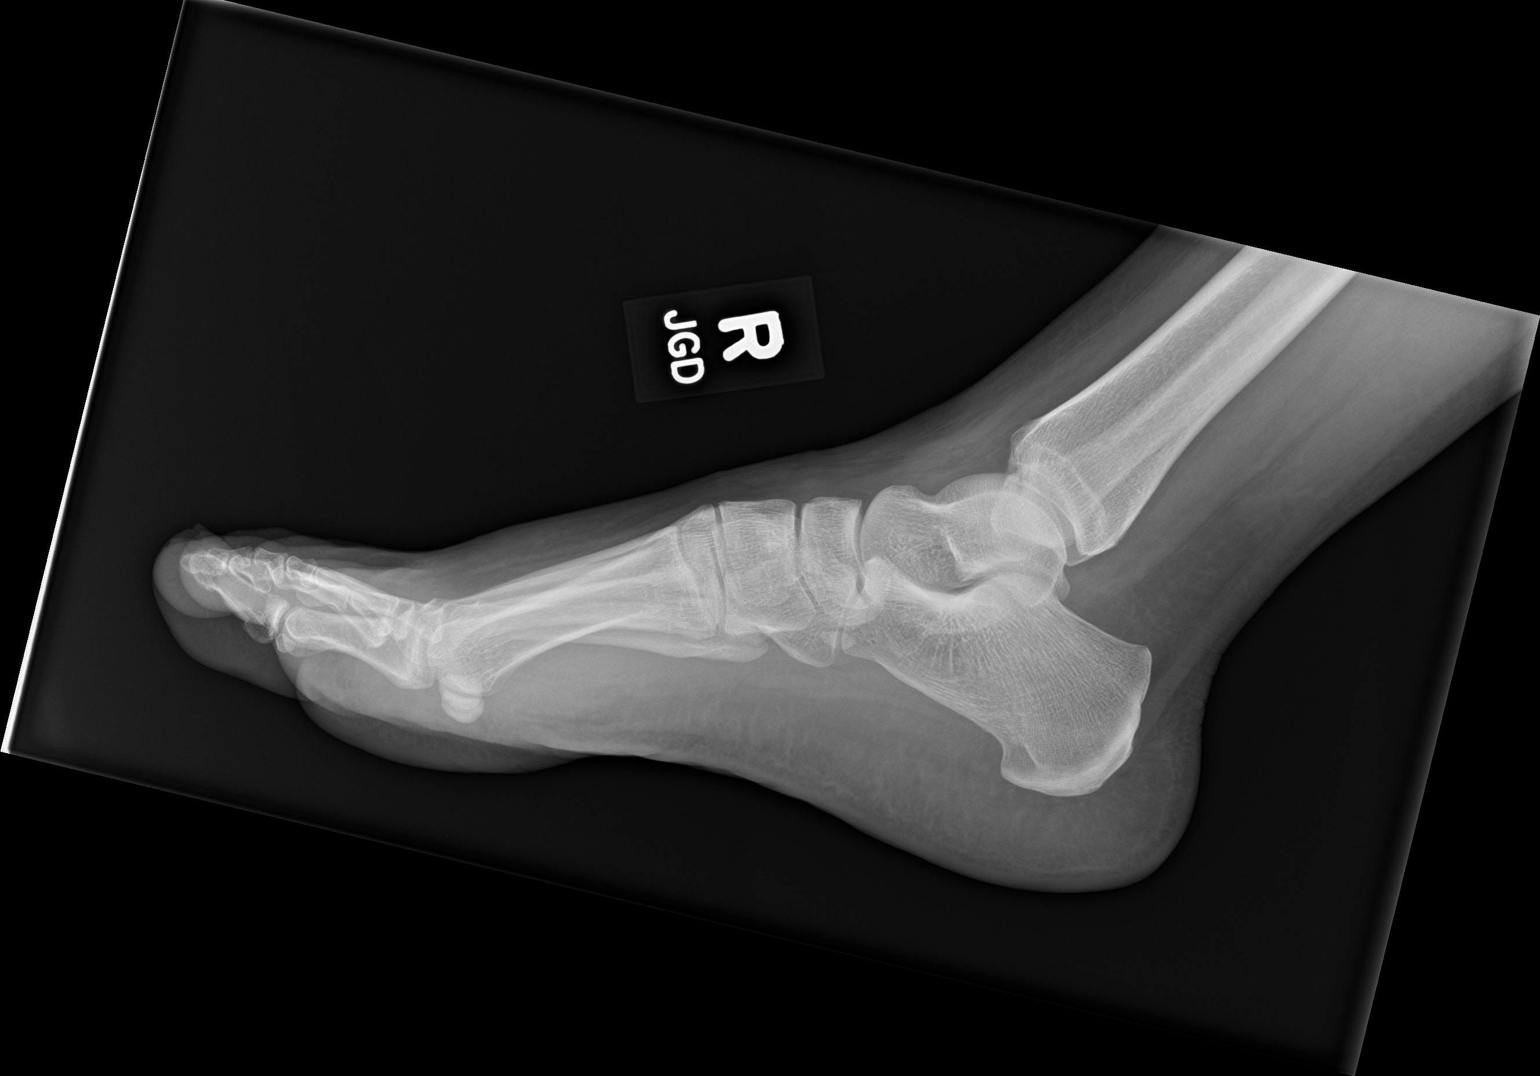

[3 of 3 positions shown; findings below may reference images not displayed]

FINDINGS: There is no evidence of fracture or dislocation. There is no
evidence of arthropathy or other focal bone abnormality. Soft
tissues are unremarkable.
IMPRESSION: Negative.

## 2020-09-14 NOTE — ED Triage Notes (Addendum)
Pt present fall today and injured her right foot, pt states she cannot bare any weight on the foot or any range of motion.

## 2020-09-14 NOTE — Discharge Instructions (Addendum)
You can take Tylenol and/or Ibuprofen as needed for pain.    Rest as much as possible Ice for 10-15 minutes every 4-6 hours as needed for pain and swelling Compression- use an ace bandage or splint for comfort, especially when walking or standing Elevate above your hip when sitting and laying down  Follow up with sports medicine or orthopedics if symptoms do not improve in the next week.

## 2020-09-14 NOTE — ED Provider Notes (Signed)
[facility]    CSN: [id number] Arrival date & time: 09/14/20  1808      History   Chief Complaint Chief Complaint  Patient presents with   Fall    HPI Kristen Fleming is a 35 y.o. female.   Here for evaluation of right foot pain and swelling.  Reports slipping and falling on foot earlier today.  Reports initially okay but pain has gotten increasingly worse and is now no longer able to stand on it.  Reports decreased range of motion.  Has not taken any OTC medications or treatments.  Denies any fevers, chest pain, shortness of breath, N/V/D, numbness, tingling, weakness, abdominal pain, or headaches.     The history is provided by the patient.  Fall   Past Medical History:  Diagnosis Date   Bilateral knee pain    Hypertension    Obesity (BMI 30.0-34.9)     Patient Active Problem List   Diagnosis Date Noted   Obesity (BMI 30.0-34.9)    Hypertension    Bilateral knee pain    PAIN IN JOINT, MULTIPLE SITES 08/23/2009   MENSTRUAL PAIN 07/15/2008   ECZEMA 07/15/2008    History reviewed. No pertinent surgical history.  OB History     Gravida  0   Para  0   Term  0   Preterm  0   AB  0   Living  0      SAB  0   IAB  0   Ectopic  0   Multiple  0   Live Births  0            Home Medications    Prior to Admission medications   Not on File    Family History Family History  Problem Relation Age of Onset   Hypertension Maternal Grandmother    Diabetes Maternal Grandmother    Breast cancer Paternal Grandmother 65    Social History Social History   Tobacco Use   Smoking status: Never   Smokeless tobacco: Never  Vaping Use   Vaping Use: Never used  Substance Use Topics   Alcohol use: Yes    Comment: occasional   Drug use: Never     Allergies   Patient has no known allergies.   Review of Systems Review of Systems  Musculoskeletal:  Positive for arthralgias and joint swelling.  All other systems reviewed and  are negative.   Physical Exam Triage Vital Signs ED Triage Vitals  Enc Vitals Group     BP 09/14/20 1918 (!) 162/98     Pulse Rate 09/14/20 1918 85     Resp 09/14/20 1918 18     Temp 09/14/20 1918 98.3 F (36.8 C)     Temp Source 09/14/20 1918 Oral     SpO2 09/14/20 1918 100 %     Weight --      Height --      Head Circumference --      Peak Flow --      Pain Score 09/14/20 1919 10     Pain Loc --      Pain Edu? --      Excl. in GC? --    No data found.  Updated Vital Signs BP (!) 162/98 (BP Location: Right Arm)   Pulse 85   Temp 98.3 F (36.8 C) (Oral)   Resp 18   LMP 09/14/2020   SpO2 100%   Visual Acuity Right Eye Distance:   Left  Eye Distance:   Bilateral Distance:    Right Eye Near:   Left Eye Near:    Bilateral Near:     Physical Exam Vitals and nursing note reviewed.  Constitutional:      General: She is not in acute distress.    Appearance: Normal appearance. She is not ill-appearing, toxic-appearing or diaphoretic.  HENT:     Head: Normocephalic and atraumatic.  Eyes:     Conjunctiva/sclera: Conjunctivae normal.  Cardiovascular:     Rate and Rhythm: Normal rate.     Pulses: Normal pulses.  Pulmonary:     Effort: Pulmonary effort is normal.  Abdominal:     General: Abdomen is flat.  Musculoskeletal:     Cervical back: Normal range of motion.     Right ankle: Swelling present. Tenderness present. Decreased range of motion. Anterior drawer test negative. Normal pulse.     Right Achilles Tendon: Normal.     Right foot: Decreased range of motion. Normal capillary refill. Swelling, tenderness and bony tenderness present. No deformity or crepitus. Normal pulse.  Skin:    General: Skin is warm and dry.  Neurological:     General: No focal deficit present.     Mental Status: She is alert and oriented to person, place, and time.  Psychiatric:        Mood and Affect: Mood normal.     UC Treatments / Results  Labs (all labs ordered are listed,  but only abnormal results are displayed) Labs Reviewed - No data to display  EKG   Radiology DG Foot Complete Right  Result Date: 09/14/2020 CLINICAL DATA:  35 year old female with fall and trauma to the right foot. EXAM: RIGHT FOOT COMPLETE - 3+ VIEW COMPARISON:  None. FINDINGS: There is no evidence of fracture or dislocation. There is no evidence of arthropathy or other focal bone abnormality. Soft tissues are unremarkable. IMPRESSION: Negative. Electronically Signed   By: Elgie Collard M.D.   On: 09/14/2020 19:39    Procedures Procedures (including critical care time)  Medications Ordered in UC Medications - No data to display  Initial Impression / Assessment and Plan / UC Course  I have reviewed the triage vital signs and the nursing notes.  Pertinent labs & imaging results that were available during my care of the patient were reviewed by me and considered in my medical decision making (see chart for details).    Assessment negative for red flags or concerns.  X-ray with no acute bony abnormalities.  Ace bandage applied in office.  Recommend Tylenol and/or ibuprofen as needed for pain.  Recommend rest, ice, compression, and elevation.  Follow-up with orthopedics if symptoms do not improve in the next week. Final Clinical Impressions(s) / UC Diagnoses   Final diagnoses:  Foot sprain, right, initial encounter  Fall, initial encounter     Discharge Instructions      You can take Tylenol and/or Ibuprofen as needed for pain.    Rest as much as possible Ice for 10-15 minutes every 4-6 hours as needed for pain and swelling Compression- use an ace bandage or splint for comfort, especially when walking or standing Elevate above your hip when sitting and laying down  Follow up with sports medicine or orthopedics if symptoms do not improve in the next week.       ED Prescriptions   None    PDMP not reviewed this encounter.   Ivette Loyal, NP 09/14/20 2001

## 2020-10-26 ENCOUNTER — Other Ambulatory Visit (HOSPITAL_BASED_OUTPATIENT_CLINIC_OR_DEPARTMENT_OTHER): Payer: Self-pay

## 2020-10-26 ENCOUNTER — Other Ambulatory Visit: Payer: Self-pay

## 2020-10-26 ENCOUNTER — Ambulatory Visit: Payer: No Typology Code available for payment source | Attending: Internal Medicine

## 2020-10-26 DIAGNOSIS — Z23 Encounter for immunization: Secondary | ICD-10-CM

## 2020-10-26 MED ORDER — PFIZER-BIONT COVID-19 VAC-TRIS 30 MCG/0.3ML IM SUSP
INTRAMUSCULAR | 0 refills | Status: DC
  Filled 2020-10-26: qty 0.3, 1d supply, fill #0

## 2020-10-26 NOTE — Progress Notes (Signed)
   Covid-19 Vaccination Clinic  Name:  Kristen Fleming    MRN: [id number] DOB: 13-Oct-1985  10/26/2020  Ms. Kristen Fleming was observed post Covid-19 immunization for 15 minutes without incident. She was provided with Vaccine Information Sheet and instruction to access the V-Safe system.   Ms. Kristen Fleming was instructed to call 911 with any severe reactions post vaccine: Difficulty breathing  Swelling of face and throat  A fast heartbeat  A bad rash all over body  Dizziness and weakness   Immunizations Administered     Name Date Dose VIS Date Route   PFIZER Comrnaty(Gray TOP) Covid-19 Vaccine 10/26/2020 10:33 AM 0.3 mL 02/24/2020 Intramuscular   Manufacturer: [employer], [employer]   Lot: [id number]   NDC: [phone]

## 2021-01-17 ENCOUNTER — Other Ambulatory Visit: Payer: Self-pay

## 2021-01-18 ENCOUNTER — Encounter: Payer: Self-pay | Admitting: Internal Medicine

## 2021-01-18 ENCOUNTER — Ambulatory Visit (INDEPENDENT_AMBULATORY_CARE_PROVIDER_SITE_OTHER): Payer: No Typology Code available for payment source | Admitting: Internal Medicine

## 2021-01-18 VITALS — BP 120/84 | HR 72 | Temp 98.4°F | Ht 65.5 in | Wt 216.9 lb

## 2021-01-18 DIAGNOSIS — I10 Essential (primary) hypertension: Secondary | ICD-10-CM

## 2021-01-18 DIAGNOSIS — Z0000 Encounter for general adult medical examination without abnormal findings: Secondary | ICD-10-CM

## 2021-01-18 DIAGNOSIS — E669 Obesity, unspecified: Secondary | ICD-10-CM | POA: Diagnosis not present

## 2021-01-18 DIAGNOSIS — K219 Gastro-esophageal reflux disease without esophagitis: Secondary | ICD-10-CM | POA: Diagnosis not present

## 2021-01-18 LAB — CBC WITH DIFFERENTIAL/PLATELET
Basophils Absolute: 0 10*3/uL (ref 0.0–0.1)
Basophils Relative: 0.6 % (ref 0.0–3.0)
Eosinophils Absolute: 0.2 10*3/uL (ref 0.0–0.7)
Eosinophils Relative: 4.2 % (ref 0.0–5.0)
HCT: 39.9 % (ref 36.0–46.0)
Hemoglobin: 12.9 g/dL (ref 12.0–15.0)
Lymphocytes Relative: 31.1 % (ref 12.0–46.0)
Lymphs Abs: 1.8 10*3/uL (ref 0.7–4.0)
MCHC: 32.4 g/dL (ref 30.0–36.0)
MCV: 87.5 fl (ref 78.0–100.0)
Monocytes Absolute: 0.5 10*3/uL (ref 0.1–1.0)
Monocytes Relative: 8.1 % (ref 3.0–12.0)
Neutro Abs: 3.2 10*3/uL (ref 1.4–7.7)
Neutrophils Relative %: 56 % (ref 43.0–77.0)
Platelets: 258 10*3/uL (ref 150.0–400.0)
RBC: 4.56 Mil/uL (ref 3.87–5.11)
RDW: 13.8 % (ref 11.5–15.5)
WBC: 5.7 10*3/uL (ref 4.0–10.5)

## 2021-01-18 LAB — COMPREHENSIVE METABOLIC PANEL
ALT: 18 U/L (ref 0–35)
AST: 15 U/L (ref 0–37)
Albumin: 3.9 g/dL (ref 3.5–5.2)
Alkaline Phosphatase: 72 U/L (ref 39–117)
BUN: 11 mg/dL (ref 6–23)
CO2: 28 mEq/L (ref 19–32)
Calcium: 9.3 mg/dL (ref 8.4–10.5)
Chloride: 102 mEq/L (ref 96–112)
Creatinine, Ser: 0.86 mg/dL (ref 0.40–1.20)
GFR: 87.38 mL/min (ref >60.00)
Glucose, Bld: 93 mg/dL (ref 70–99)
Potassium: 4.2 mEq/L (ref 3.5–5.1)
Sodium: 135 mEq/L (ref 135–145)
Total Bilirubin: 0.5 mg/dL (ref 0.2–1.2)
Total Protein: 7.7 g/dL (ref 6.0–8.3)

## 2021-01-18 LAB — LIPID PANEL
Cholesterol: 162 mg/dL (ref 0–200)
HDL: 51.5 mg/dL (ref >39.00)
LDL Cholesterol: 102 mg/dL — ABNORMAL HIGH (ref 0–99)
NonHDL: 110.8
Total CHOL/HDL Ratio: 3
Triglycerides: 45 mg/dL (ref 0.0–149.0)
VLDL: 9 mg/dL (ref 0.0–40.0)

## 2021-01-18 NOTE — Patient Instructions (Signed)
-  Nice seeing you today!! ? ?-Lab work today; will notify you once results are available. ? ?-Remember your COVID booster at the pharmacy. ? ?-Schedule follow up in 6 months. ?

## 2021-01-18 NOTE — Progress Notes (Signed)
Established Patient Office Visit     This visit occurred during the SARS-CoV-2 public health emergency.  Safety protocols were in place, including screening questions prior to the visit, additional usage of staff PPE, and extensive cleaning of exam room while observing appropriate contact time as indicated for disinfecting solutions.    CC/Reason for Visit: Annual preventive exam  HPI: Kristen Fleming is a 35 y.o. female who is coming in today for the above mentioned reasons. Past Medical History is significant for: Hypertension that is being monitored off medications and obesity.  She feels well today.  She has been having some intermittent heartburn especially when lying down at nighttime, this occurs 2-3 nights a week.  She has routine eye and dental care.  She is due for COVID booster but otherwise immunizations are up-to-date.  She follows with GYN and had a Pap smear in July 2021.   Past Medical/Surgical History: Past Medical History:  Diagnosis Date   Bilateral knee pain    Hypertension    Obesity (BMI 30.0-34.9)     No past surgical history on file.  Social History:  reports that she has never smoked. She has never used smokeless tobacco. She reports current alcohol use. She reports that she does not use drugs.  Allergies: No Known Allergies  Family History:  Family History  Problem Relation Age of Onset   Hypertension Maternal Grandmother    Diabetes Maternal Grandmother    Breast cancer Paternal Grandmother 67    No current outpatient medications on file.  Review of Systems:  Constitutional: Denies fever, chills, diaphoresis, appetite change and fatigue.  HEENT: Denies photophobia, eye pain, redness, hearing loss, ear pain, congestion, sore throat, rhinorrhea, sneezing, mouth sores, trouble swallowing, neck pain, neck stiffness and tinnitus.   Respiratory: Denies SOB, DOE, cough, chest tightness,  and wheezing.   Cardiovascular: Denies chest pain,  palpitations and leg swelling.  Gastrointestinal: Denies nausea, vomiting, abdominal pain, diarrhea, constipation, blood in stool and abdominal distention.  Genitourinary: Denies dysuria, urgency, frequency, hematuria, flank pain and difficulty urinating.  Endocrine: Denies: hot or cold intolerance, sweats, changes in hair or nails, polyuria, polydipsia. Musculoskeletal: Denies myalgias, back pain, joint swelling, arthralgias and gait problem.  Skin: Denies pallor, rash and wound.  Neurological: Denies dizziness, seizures, syncope, weakness, light-headedness, numbness and headaches.  Hematological: Denies adenopathy. Easy bruising, personal or family bleeding history  Psychiatric/Behavioral: Denies suicidal ideation, mood changes, confusion, nervousness, sleep disturbance and agitation    Physical Exam: Vitals:   01/18/21 1027  BP: 120/84  Pulse: 72  Temp: 98.4 F (36.9 C)  TempSrc: Oral  SpO2: 99%  Weight: 216 lb 14.4 oz (98.4 kg)  Height: 5' 5.5" (1.664 m)    Body mass index is 35.55 kg/m.   Constitutional: NAD, calm, comfortable Eyes: PERRL, lids and conjunctivae normal, wears corrective lenses ENMT: Mucous membranes are moist. Posterior pharynx clear of any exudate or lesions. Normal dentition. Tympanic membrane is pearly white, no erythema or bulging. Neck: normal, supple, no masses, no thyromegaly Respiratory: clear to auscultation bilaterally, no wheezing, no crackles. Normal respiratory effort. No accessory muscle use.  Cardiovascular: Regular rate and rhythm, no murmurs / rubs / gallops. No extremity edema. 2+ pedal pulses. No carotid bruits.  Abdomen: no tenderness, no masses palpated. No hepatosplenomegaly. Bowel sounds positive.  Musculoskeletal: no clubbing / cyanosis. No joint deformity upper and lower extremities. Good ROM, no contractures. Normal muscle tone.  Skin: no rashes, lesions, ulcers. No induration Neurologic: CN  2-12 grossly intact. Sensation intact,  DTR normal. Strength 5/5 in all 4.  Psychiatric: Normal judgment and insight. Alert and oriented x 3. Normal mood.    Impression and Plan:  Encounter for preventive health examination -Recommend routine eye and dental care. -Immunizations: She will get COVID booster at pharmacy -Healthy lifestyle discussed in detail. -Labs to be updated today. -Colon cancer screening: Commence at age 87 -Breast cancer screening: Commence at age 65 -Cervical cancer screening: July 2021 -Lung cancer screening: Not applicable -Prostate cancer screening: Not applicable -DEXA: Not applicable  Primary hypertension  - Plan: CBC with Differential/Platelet, Comprehensive metabolic panel, Lipid panel -Well-controlled today off medication.  Obesity (BMI 35.0-39.9 without comorbidity) -Discussed healthy lifestyle, including increased physical activity and better food choices to promote weight loss.  Gastroesophageal reflux disease, unspecified whether esophagitis present -Since not occurring daily, we have discussed use of Prilosec OTC as needed.  She will reach out to me if symptoms persist.    Patient Instructions  -Nice seeing you today!!  -Lab work today; will notify you once results are available.  -Remember your COVID booster at the pharmacy.  -Schedule follow up in 6 months.    Lelon Frohlich, MD [facility]

## 2021-01-18 NOTE — Addendum Note (Signed)
Addended by: Kandra Nicolas on: 01/18/2021 10:55 AM   Modules accepted: Orders

## 2021-03-28 ENCOUNTER — Encounter: Payer: Self-pay | Admitting: Internal Medicine

## 2021-03-28 ENCOUNTER — Encounter: Payer: Self-pay | Admitting: Nurse Practitioner

## 2021-03-29 ENCOUNTER — Other Ambulatory Visit: Payer: Self-pay

## 2021-03-29 DIAGNOSIS — Z3046 Encounter for surveillance of implantable subdermal contraceptive: Secondary | ICD-10-CM

## 2021-04-04 NOTE — Progress Notes (Signed)
° °  Kristen Fleming 08-20-1985 [id number]   History:  36 y.o. G0 presents for annual exam. Nexplanon 04/2020. She has had frequent prolonged bleeding since insertion. Bleeding ranges from light to heavy and occurs most days. She may get 4 days of no bleeding before it returns. Was on Depo in the past for 6 years with recommendations to switch to alternative for 1-2 years d/t risk of bone weakness. She plans to have Nexplanon removed 04/11/2020 and restart Depo at that time. Normal pap history. HTN, GERD managed by PCP.   Gynecologic History Patient's last menstrual period was 03/13/2021 (approximate).   Contraception/Family planning: Nexplanon Sexually active: No  Health Maintenance Last Pap: 09/29/2019. Results were: Normal, 5-year repeat Last mammogram: Not indicated Last colonoscopy: Not indicated Last Dexa: Not indicated  Past medical history, past surgical history, family history and social history were all reviewed and documented in the EPIC chart. 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   ROS:  A ROS was performed and pertinent positives and negatives are included.  Exam:  Vitals:   04/05/21 1022  BP: 124/78  Weight: 216 lb (98 kg)  Height: 5\' 5"  (1.651 m)   Body mass index is 35.94 kg/m.  General appearance:  Normal Thyroid:  Symmetrical, normal in size, without palpable masses or nodularity. Respiratory  Auscultation:  Clear without wheezing or rhonchi Cardiovascular  Auscultation:  Regular rate, without rubs, murmurs or gallops  Edema/varicosities:  Not grossly evident Abdominal  Soft,nontender, without masses, guarding or rebound.  Liver/spleen:  No organomegaly noted  Hernia:  None appreciated  Skin  Inspection:  Grossly normal Breasts: Examined lying and sitting.   Right: Without masses, retractions, nipple discharge or axillary adenopathy.   Left: Without masses, retractions, nipple discharge or axillary adenopathy. Genitourinary   Inguinal/mons:  Normal without  inguinal adenopathy  External genitalia:  Normal appearing vulva with no masses, tenderness, or lesions  BUS/Urethra/Skene's glands:  Normal  Vagina:  Normal appearing with normal color and discharge, no lesions. Menstrual bleeding present  Cervix:  Normal appearing without discharge or lesions  Uterus:  Normal in size, shape and contour.  Midline and mobile, nontender  Adnexa/parametria:     Rt: Normal in size, without masses or tenderness.   Lt: Normal in size, without masses or tenderness.  Anus and perineum: Normal  Patient informed chaperone available to be present for breast and pelvic exam. Patient has requested no chaperone to be present. Patient has been advised what will be completed during breast and pelvic exam.   Assessment/Plan:  36 y.o. G0 for annual exam.   Well female exam with routine gynecological exam - Education provided on SBEs, importance of preventative screenings, current guidelines, high calcium diet, regular exercise, and multivitamin daily.  Labs with PCP.  Encounter for surveillance of implantable subdermal contraceptive - Nexplanon 04/2020. She is scheduled for Nexplanon removal 04/11/2020 and wants to restart Depo at that time.  Screening for cervical cancer - Normal Pap history.  Will repeat at 5-year interval per guidelines.  Return in 1 year for annual.     04/13/2020 DNP, 10:53 AM 04/05/2021

## 2021-04-05 ENCOUNTER — Ambulatory Visit (INDEPENDENT_AMBULATORY_CARE_PROVIDER_SITE_OTHER): Payer: No Typology Code available for payment source | Admitting: Nurse Practitioner

## 2021-04-05 ENCOUNTER — Encounter: Payer: Self-pay | Admitting: Nurse Practitioner

## 2021-04-05 ENCOUNTER — Other Ambulatory Visit: Payer: Self-pay

## 2021-04-05 VITALS — BP 124/78 | Ht 65.0 in | Wt 216.0 lb

## 2021-04-05 DIAGNOSIS — Z3046 Encounter for surveillance of implantable subdermal contraceptive: Secondary | ICD-10-CM | POA: Diagnosis not present

## 2021-04-05 DIAGNOSIS — Z01419 Encounter for gynecological examination (general) (routine) without abnormal findings: Secondary | ICD-10-CM | POA: Diagnosis not present

## 2021-04-11 ENCOUNTER — Other Ambulatory Visit: Payer: Self-pay

## 2021-04-11 ENCOUNTER — Encounter: Payer: Self-pay | Admitting: Nurse Practitioner

## 2021-04-11 ENCOUNTER — Ambulatory Visit (INDEPENDENT_AMBULATORY_CARE_PROVIDER_SITE_OTHER): Payer: No Typology Code available for payment source | Admitting: Nurse Practitioner

## 2021-04-11 VITALS — BP 122/78

## 2021-04-11 DIAGNOSIS — Z3046 Encounter for surveillance of implantable subdermal contraceptive: Secondary | ICD-10-CM | POA: Diagnosis not present

## 2021-04-11 DIAGNOSIS — Z30013 Encounter for initial prescription of injectable contraceptive: Secondary | ICD-10-CM | POA: Diagnosis not present

## 2021-04-11 MED ORDER — MEDROXYPROGESTERONE ACETATE 150 MG/ML IM SUSP
150.0000 mg | Freq: Once | INTRAMUSCULAR | Status: AC
  Administered 2021-04-11: 10:00:00 150 mg via INTRAMUSCULAR

## 2021-04-11 NOTE — Progress Notes (Signed)
36 y.o. G0P0000  female presents for Nexplanon removal.  She has been experiencing frequent prolonged bleeding.  She has decided to use Depo Provera for future contraception.  Procedure, risks and benefits have all been explained.  She has the following questions today:  None.     LMP:  03/13/2021   After all questions were answered, consent was obtained.    Past Medical History:  Diagnosis Date   Bilateral knee pain    Hypertension    Obesity (BMI 30.0-34.9)     No past surgical history on file.  Current Outpatient Medications on File Prior to Visit  Medication Sig Dispense Refill   etonogestrel (NEXPLANON) 68 MG IMPL implant 1 each by Subdermal route once.     No current facility-administered medications on file prior to visit.   No Known Allergies  Vitals:   04/11/21 0949  BP: 122/78    Physical Exam Constitutional:      Appearance: Normal appearance.    Procedure: Patient placed supine on exam table with her left arm flexed at the elbow. The prior insertion site was located and the Nexplanon rod was palpated.  Area cleansed with Betadine x 3 and draped in normal sterile fashion.  Insertion site and surrounding tissue anesthetized with 1% Lidocaine without epinephrine, 1 cc total used.  Small incision made with #11 blade.  Nexplanon removed without difficulty.  Steri-strips were applied and pressure dressing placed over the site.  Entire procedure performed with sterile technique.  Pt tolerated procedure well.  Assessment: Nexplanon removal  Plan:  Post procedure instructions reviewed with pt.  Questions answered.  Pt knows to call with any concerns or questions.  New contraception:  Depo Provera administered today

## 2021-04-11 NOTE — Addendum Note (Signed)
Addended by: Dayna Barker on: 04/11/2021 10:23 AM   Modules accepted: Orders

## 2021-04-16 ENCOUNTER — Encounter: Payer: Self-pay | Admitting: Internal Medicine

## 2021-06-06 ENCOUNTER — Ambulatory Visit (INDEPENDENT_AMBULATORY_CARE_PROVIDER_SITE_OTHER): Payer: No Typology Code available for payment source | Admitting: Orthopedic Surgery

## 2021-06-06 ENCOUNTER — Ambulatory Visit: Payer: No Typology Code available for payment source

## 2021-06-06 ENCOUNTER — Ambulatory Visit (INDEPENDENT_AMBULATORY_CARE_PROVIDER_SITE_OTHER): Payer: No Typology Code available for payment source

## 2021-06-06 ENCOUNTER — Other Ambulatory Visit: Payer: Self-pay

## 2021-06-06 VITALS — Ht 65.0 in | Wt 216.0 lb

## 2021-06-06 DIAGNOSIS — M25561 Pain in right knee: Secondary | ICD-10-CM | POA: Diagnosis not present

## 2021-06-06 DIAGNOSIS — M25562 Pain in left knee: Secondary | ICD-10-CM

## 2021-06-06 DIAGNOSIS — G8929 Other chronic pain: Secondary | ICD-10-CM | POA: Diagnosis not present

## 2021-06-09 ENCOUNTER — Encounter: Payer: Self-pay | Admitting: Orthopedic Surgery

## 2021-06-09 NOTE — Progress Notes (Signed)
? ?Office Visit Note ?  ?Patient: Kristen Fleming           ?Date of Birth: 10-19-1985           ?MRN: [id number] ?Visit Date: 06/06/2021 ?Requested by: Isaac Bliss, Rayford Halsted, MD ?[address] ?[place],  [place] [address] ?PCP: Isaac Bliss, Rayford Halsted, MD ? ?Subjective: ?Chief Complaint  ?Patient presents with  ? Right Knee - Pain  ? Left Knee - Pain  ? ? ?HPI: Kristen Fleming is a 36 year old patient with bilateral knee pain right equal to left.  Diagnosed with chondromalacia of the patella at age 41.  Currently denies any history of injury but reports worsening pain over the past 3 to 4 months.  She reports weakness as well as pain which wakes her from sleep occasionally.  Describes swelling and popping.  Reports that about 15 days/month her knees will swell in alternating fashion.  Pain alternates between the left and right knee.  She also reports some plantar numbness in both feet.  She has tried a knee brace on both sides as well as an exercise program which really focused on quad strengthening and stretching.  None of these have helped.  She works as a Cabin crew at [employer]. ?             ?ROS: All systems reviewed are negative as they relate to the chief complaint within the history of present illness.  Patient denies  fevers or chills. ? ? ?Assessment & Plan: ?Visit Diagnoses:  ?1. Chronic pain of both knees   ? ? ?Plan: Impression is bilateral anterior knee pain with failure of conservative treatment.  Pain ongoing for more than 6 weeks.  Takes ibuprofen with some relief.  Tried knee brace as well as home exercise program.  Anatomic alignment factors do not appear unfavorable.  Plan bilateral knee MRI to evaluate potentially arthroscopically treatable anterior knee pathology.  Follow-up after that study. ? ?Follow-Up Instructions: Return for after MRI.  ? ?Orders:  ?Orders Placed This Encounter  ?Procedures  ? XR Knee 1-2 Views Right  ? XR KNEE 3 VIEW LEFT  ? MR  Knee Right w/o contrast  ? MR Knee Left w/o contrast  ? ?No orders of the defined types were placed in this encounter. ? ? ? ? Procedures: ?No procedures performed ? ? ?Clinical Data: ?No additional findings. ? ?Objective: ?Vital Signs: Ht 5\' 5"  (1.651 m)   Wt 216 lb (98 kg)   BMI 35.94 kg/m?  ? ?Physical Exam:  ? ?Constitutional: Patient appears well-developed ?HEENT:  ?Head: Normocephalic ?Eyes:EOM are normal ?Neck: Normal range of motion ?Cardiovascular: Normal rate ?Pulmonary/chest: Effort normal ?Neurologic: Patient is alert ?Skin: Skin is warm ?Psychiatric: Patient has normal mood and affect ? ? ?Ortho Exam: Ortho exam demonstrates pretty reasonable quad strength bilaterally.  No increased Q angle.  Pedal pulses palpable.  Mild patella femoral crepitus present bilaterally with no increased tightening of the lateral retinaculum.  Patella mobility good medially and laterally with the knee in extension.  Has 1-1/2 to 2 cm of excursion in both planes.  Negative apprehension bilaterally.  No effusion in either knee.  No joint line tenderness with stable collateral and cruciate ligaments. ? ?Specialty Comments:  ?No specialty comments available. ? ?Imaging: ?No results found. ? ? ?PMFS History: ?Patient Active Problem List  ? Diagnosis Date Noted  ? Obesity (BMI 30.0-34.9)   ? Hypertension   ? Bilateral knee pain   ? PAIN  IN JOINT, MULTIPLE SITES 08/23/2009  ? MENSTRUAL PAIN 07/15/2008  ? ECZEMA 07/15/2008  ? ?Past Medical History:  ?Diagnosis Date  ? Bilateral knee pain   ? Hypertension   ? Obesity (BMI 30.0-34.9)   ?  ?Family History  ?Problem Relation Age of Onset  ? Hypertension Maternal Grandmother   ? Diabetes Maternal Grandmother   ? Breast cancer Paternal Grandmother 62  ?  ?History reviewed. No pertinent surgical history. ?Social History  ? ?Occupational History  ? Not on file  ?Tobacco Use  ? Smoking status: Never  ? Smokeless tobacco: Never  ?Vaping Use  ? Vaping Use: Never used  ?Substance and Sexual  Activity  ? Alcohol use: Yes  ?  Comment: occasional  ? Drug use: Never  ? Sexual activity: Not Currently  ?  Birth control/protection: Implant  ?  Comment: 1st intercourse 36 yo-Fewer than 5 partners  ? ? ? ? ? ?

## 2021-06-27 ENCOUNTER — Ambulatory Visit (INDEPENDENT_AMBULATORY_CARE_PROVIDER_SITE_OTHER): Payer: No Typology Code available for payment source | Admitting: *Deleted

## 2021-06-27 ENCOUNTER — Other Ambulatory Visit: Payer: Self-pay | Admitting: Orthopedic Surgery

## 2021-06-27 DIAGNOSIS — Z30013 Encounter for initial prescription of injectable contraceptive: Secondary | ICD-10-CM | POA: Diagnosis not present

## 2021-06-27 DIAGNOSIS — G8929 Other chronic pain: Secondary | ICD-10-CM

## 2021-06-27 MED ORDER — MEDROXYPROGESTERONE ACETATE 150 MG/ML IM SUSP
150.0000 mg | Freq: Once | INTRAMUSCULAR | Status: AC
  Administered 2021-06-27: 150 mg via INTRAMUSCULAR

## 2021-06-27 NOTE — Progress Notes (Signed)
Next injection June 28th-July 12th ?

## 2021-07-04 ENCOUNTER — Ambulatory Visit
Admission: RE | Admit: 2021-07-04 | Discharge: 2021-07-04 | Disposition: A | Discharge status: Home / Self Care | Payer: No Typology Code available for payment source | Source: Ambulatory Visit | Attending: Orthopedic Surgery | Admitting: Orthopedic Surgery

## 2021-07-04 DIAGNOSIS — G8929 Other chronic pain: Secondary | ICD-10-CM

## 2021-07-04 IMAGING — MR MR KNEE*R* W/O CM
5 of 7 series · 22 of 40 positions shown · non-contrast
Comparison: Right knee radiographs [DATE]

CLINICAL DATA: Chronic anterior lateral knee pain for years.

EXAM:
MRI OF THE RIGHT KNEE WITHOUT CONTRAST
TECHNIQUE: Multiplanar, multisequence MR imaging of the knee was performed. No
intravenous contrast was administered.

[Series 3: T2 fat-sat · axial · 4.0mm · 0.50mm/px · z∈[-52,+63]mm · 5 of 24 slices shown (1 of 2)]
[im 1/24]
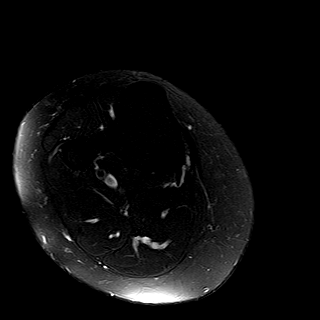
[im 6/24]
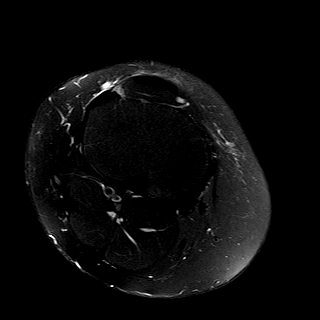
[im 12/24]
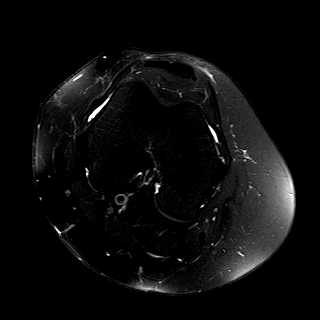
[im 18/24]
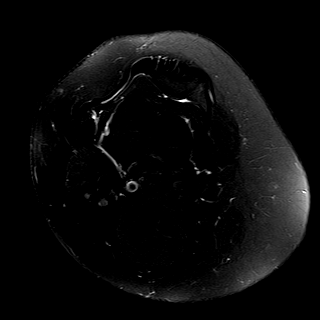
[im 24/24]
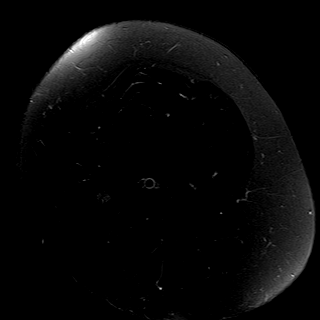

[Series 5: T2 fat-sat · coronal · 4.0mm · 0.29mm/px · 1 of 24 slices shown (2 of 2)]
[im 1/24]
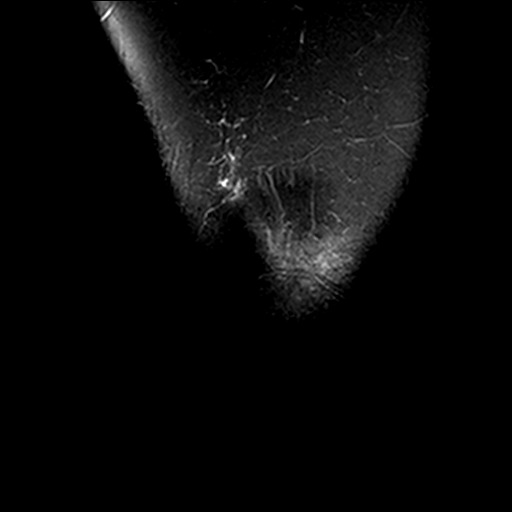

[Series 7: PD fat-sat · sagittal · 3.0mm · 0.29mm/px · 7 of 27 slices shown (1 of 3)]
[im 1/27]
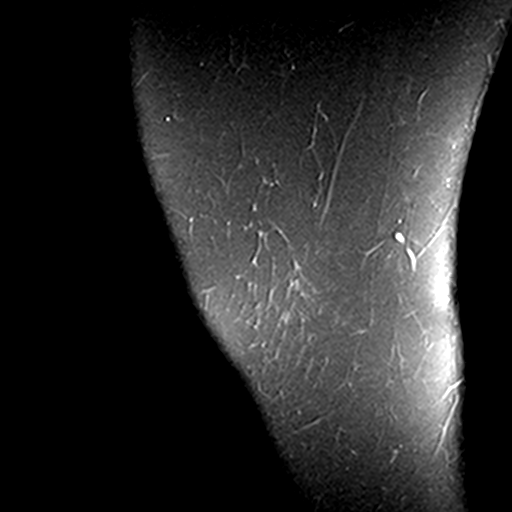
[im 5/27]
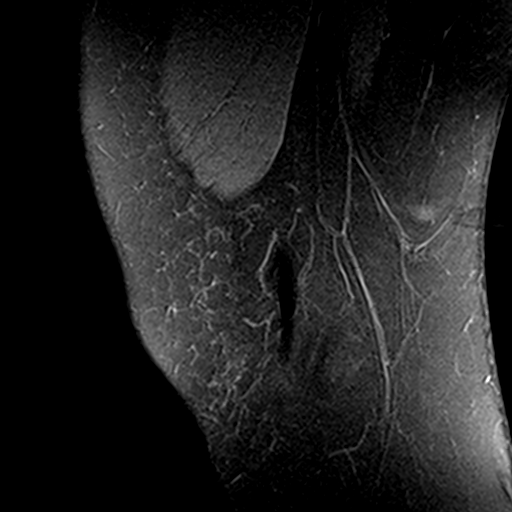
[im 9/27]
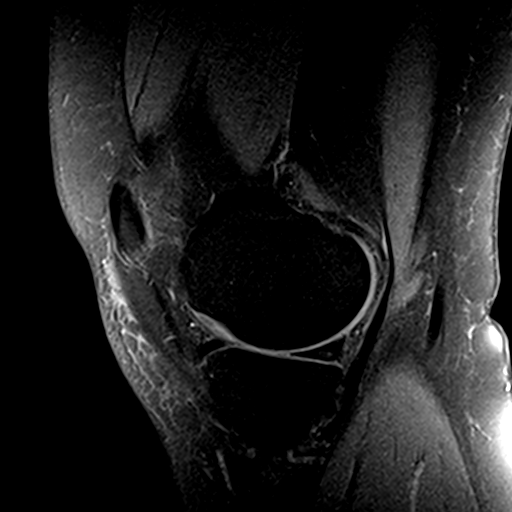
[im 14/27]
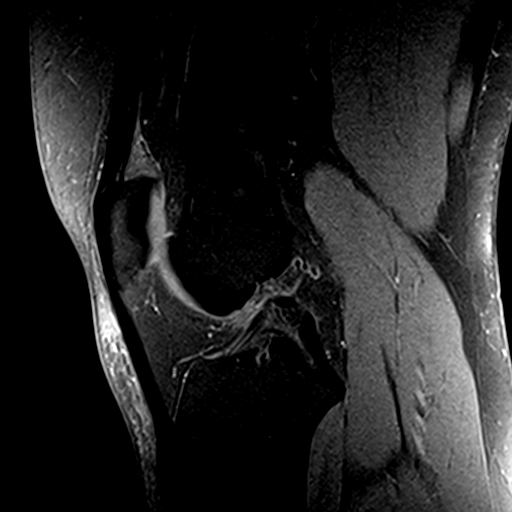
[im 18/27]
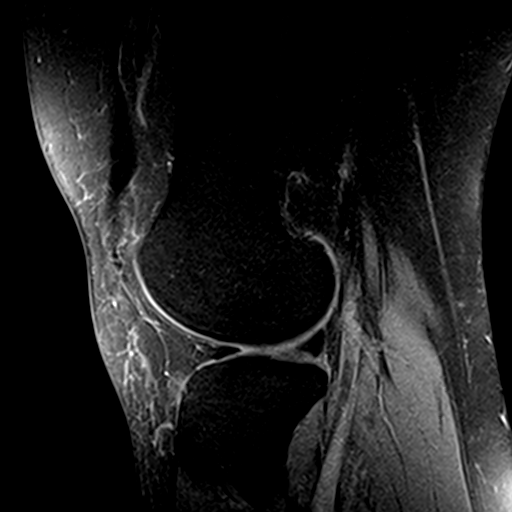
[im 22/27]
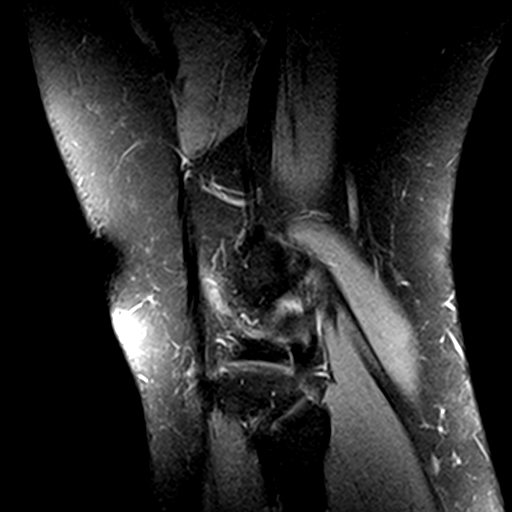
[im 27/27]
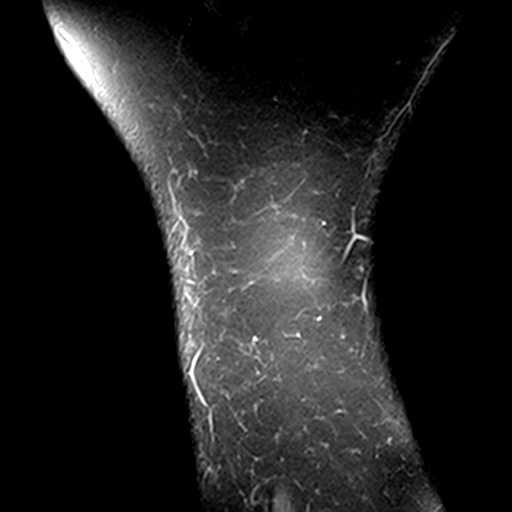

[Series 8: PD fat-sat · coronal · 4.0mm · 0.29mm/px · 6 of 24 slices shown (2 of 3)]
[im 1/24]
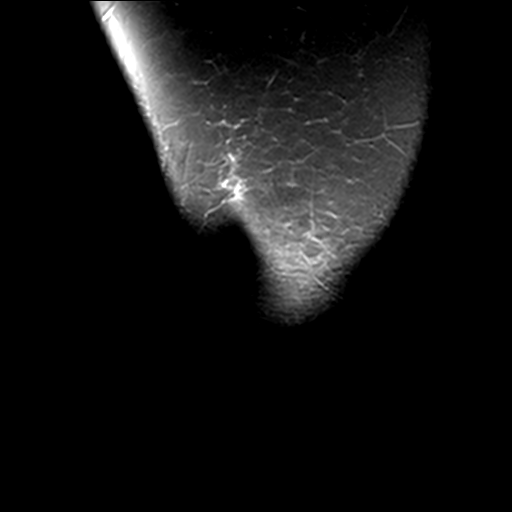
[im 5/24]
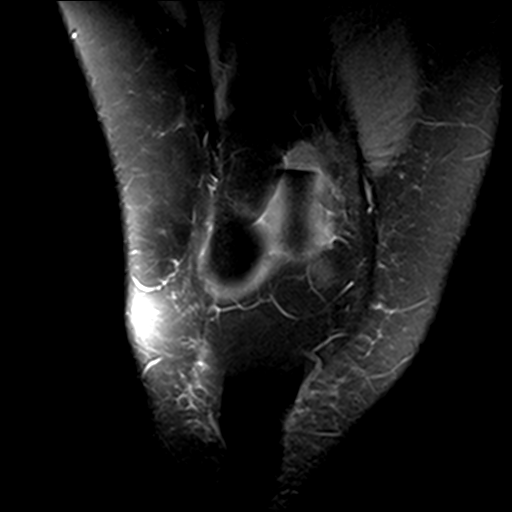
[im 10/24]
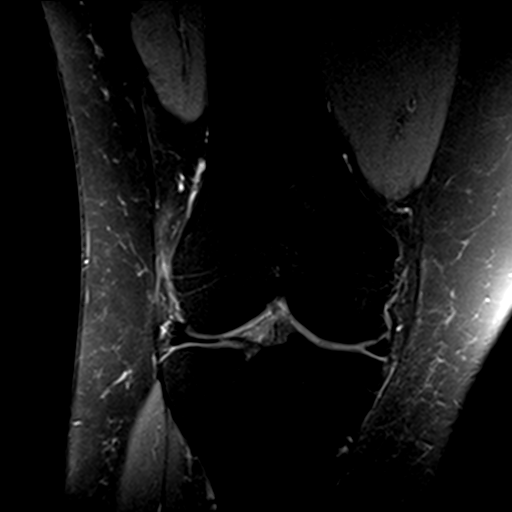
[im 14/24]
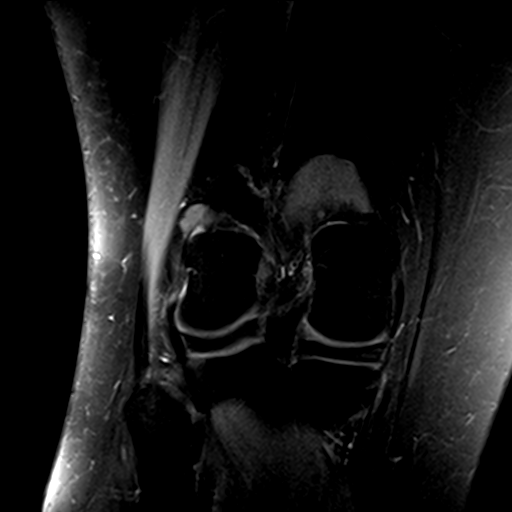
[im 19/24]
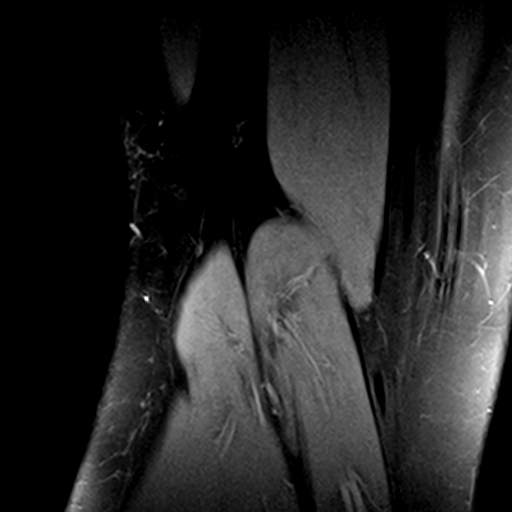
[im 24/24]
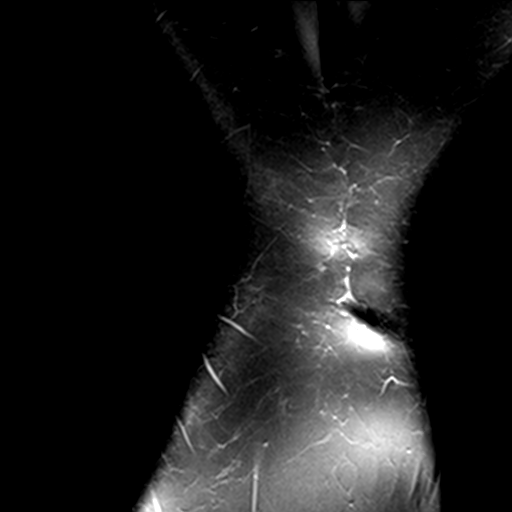

[Series 9: PD fat-sat · coronal · 2.3mm · 0.29mm/px · 3 of 11 slices shown (3 of 3)]
[im 1/11]
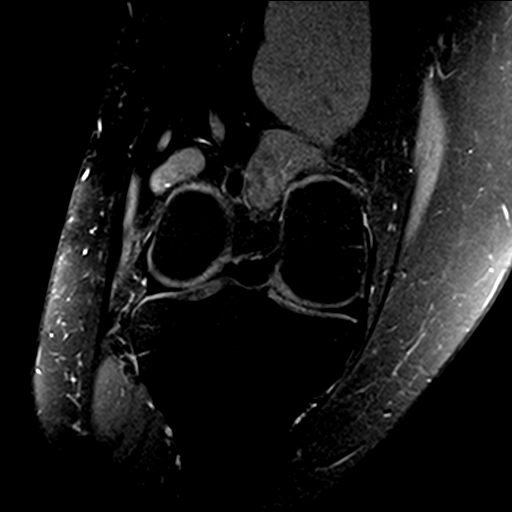
[im 6/11]
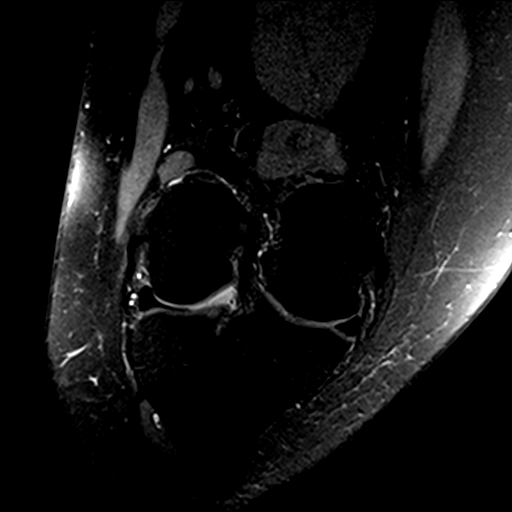
[im 11/11]
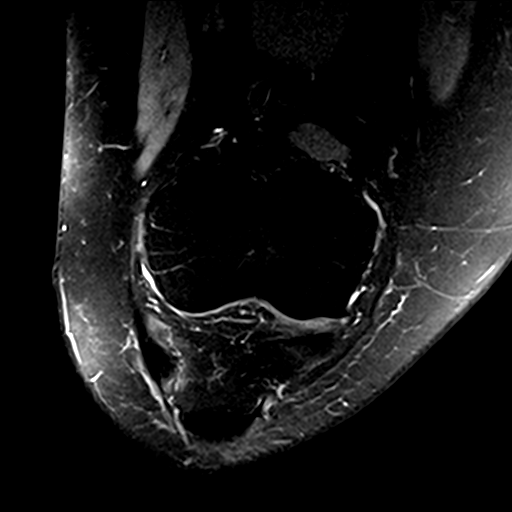

[22 of 40 positions shown; findings below may reference images not displayed]

FINDINGS: MENISCI

Medial meniscus: Mild intermediate proton density signal
intrasubstance degeneration within the posterior horn of the medial
meniscus. No tear is seen extending through the articular surface of
the medial meniscus.

Lateral meniscus:  Intact.

LIGAMENTS

Cruciates: The ACL and PCL are intact.

Collaterals: The medial collateral ligament is intact. The fibular
collateral ligament, biceps femoris tendon, iliotibial band, and
popliteus tendon are intact.

CARTILAGE

Patellofemoral:  Intact.

Medial:  Intact.

Lateral:  Intact.

Joint: Nojoint effusion. Normal Hoffa's fat pad. No plical
thickening.

Popliteal Fossa:  No Baker's cyst.

Extensor Mechanism: Minimal intermediate T2 signal tendinosis of the
distal quadriceps tendon insertion and proximal deep aspect of the
patellar tendon.

Bones:  No acute fracture or dislocation.

Other: None.
IMPRESSION: :
IMPRESSION: 1. Intact cruciate ligaments, collateral ligaments, and menisci.
2. Minimal distal quadriceps and proximal patellar tendinosis.

## 2021-07-04 IMAGING — MR MR KNEE*L* W/O CM
5 of 7 series · 22 of 40 positions shown · non-contrast
Comparison: Left knee radiographs [DATE]

CLINICAL DATA: Chronic anterior knee pain for years.

EXAM:
MRI OF THE LEFT KNEE WITHOUT CONTRAST
TECHNIQUE: Multiplanar, multisequence MR imaging of the knee was performed. No
intravenous contrast was administered.

[Series 3: T2 fat-sat · axial · 4.0mm · 0.50mm/px · z∈[-74,+41]mm · 5 of 24 slices shown (1 of 2)]
[im 1/24]
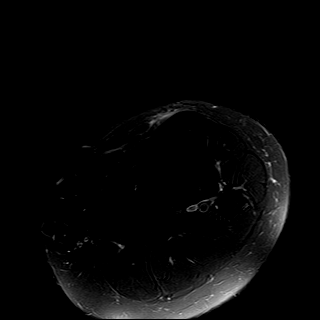
[im 6/24]
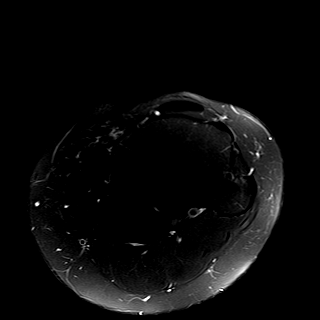
[im 12/24]
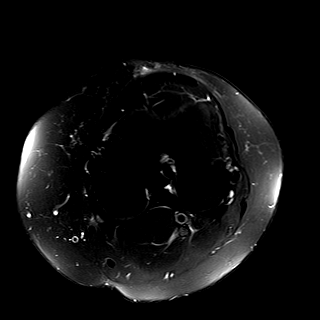
[im 18/24]
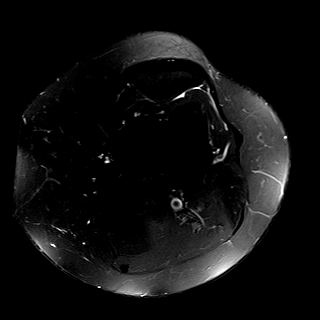
[im 24/24]
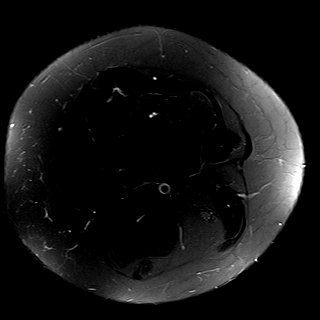

[Series 5: T2 fat-sat · coronal · 4.0mm · 0.29mm/px · 1 of 22 slices shown (2 of 2)]
[im 1/22]
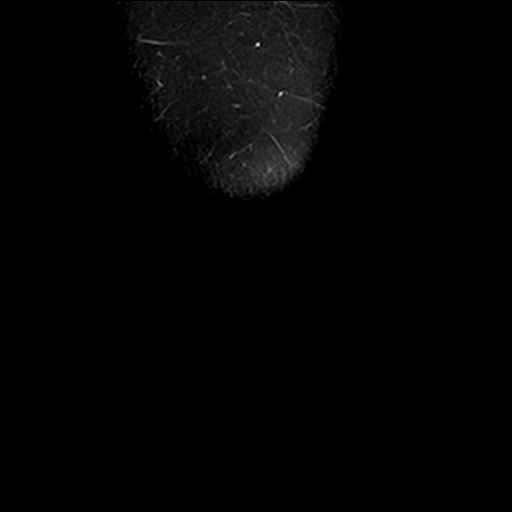

[Series 6: PD fat-sat · coronal · 4.0mm · 0.29mm/px · 6 of 22 slices shown (1 of 3)]
[im 1/22]
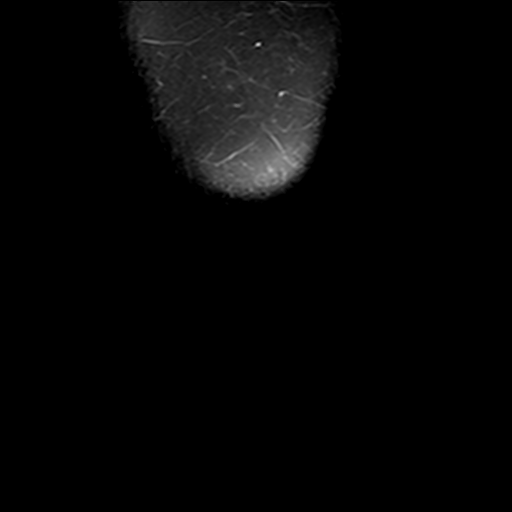
[im 5/22]
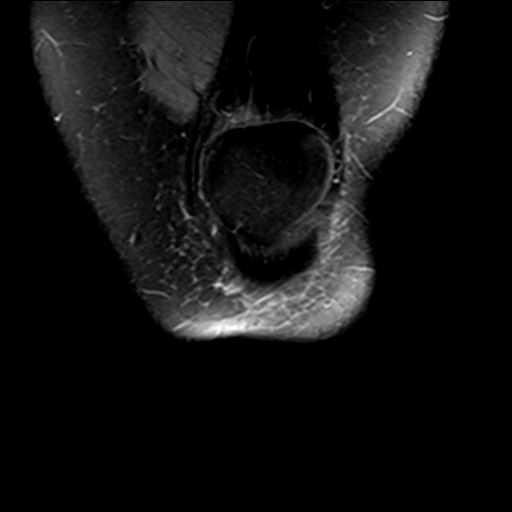
[im 9/22]
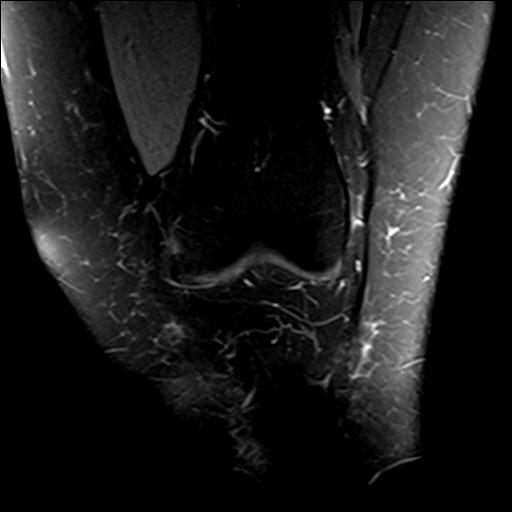
[im 13/22]
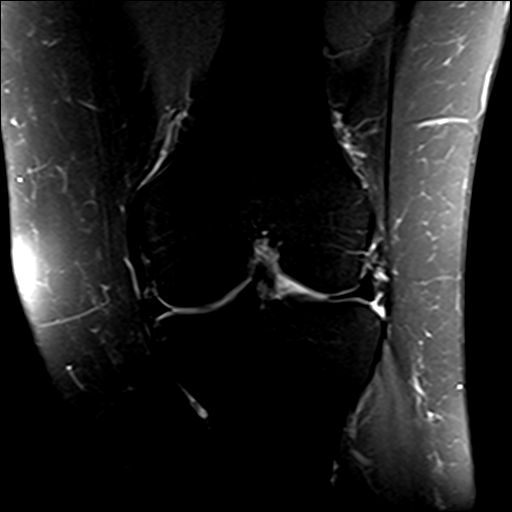
[im 17/22]
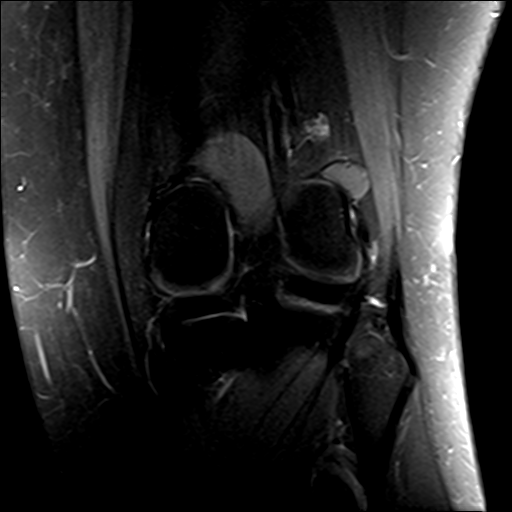
[im 22/22]
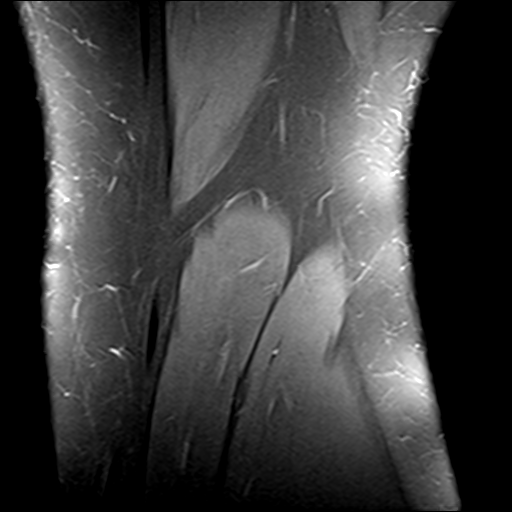

[Series 8: PD fat-sat · sagittal · 3.0mm · 0.29mm/px · 7 of 27 slices shown (2 of 3)]
[im 1/27]
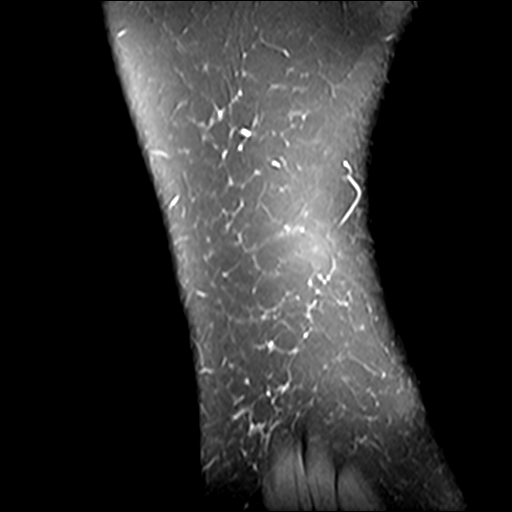
[im 5/27]
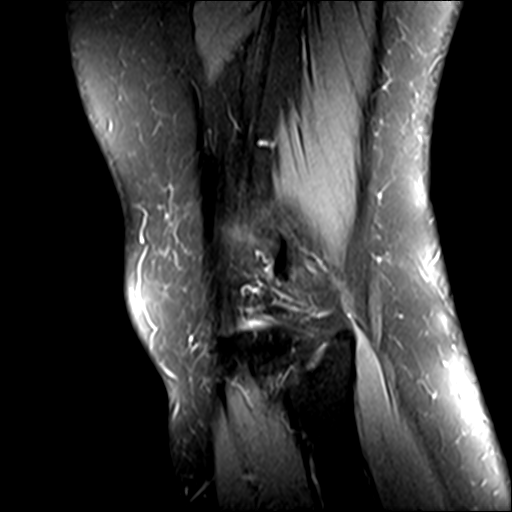
[im 9/27]
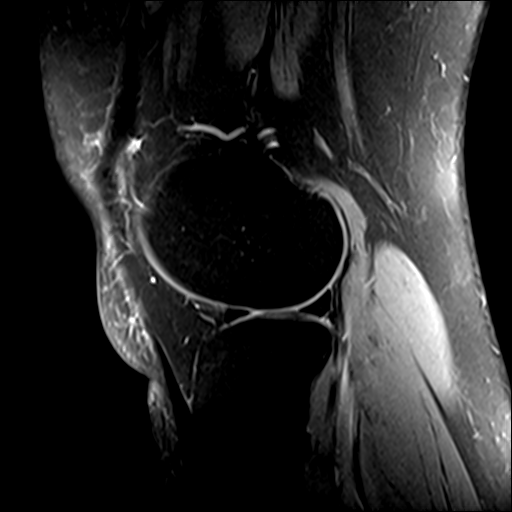
[im 14/27]
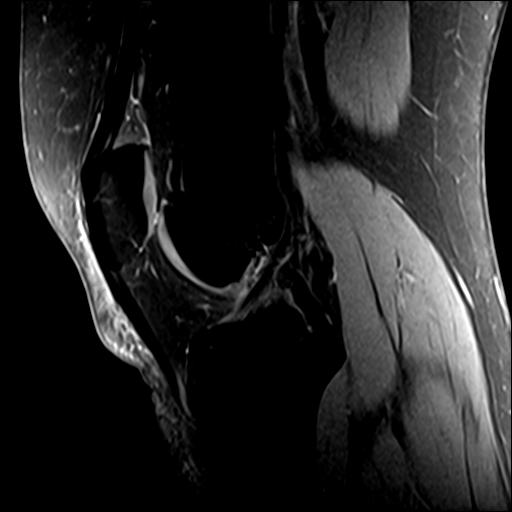
[im 18/27]
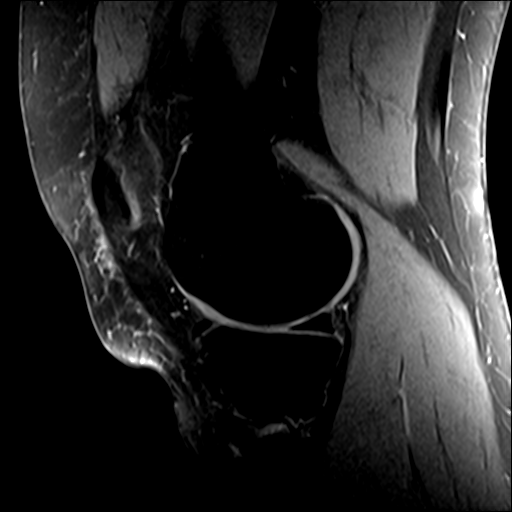
[im 22/27]
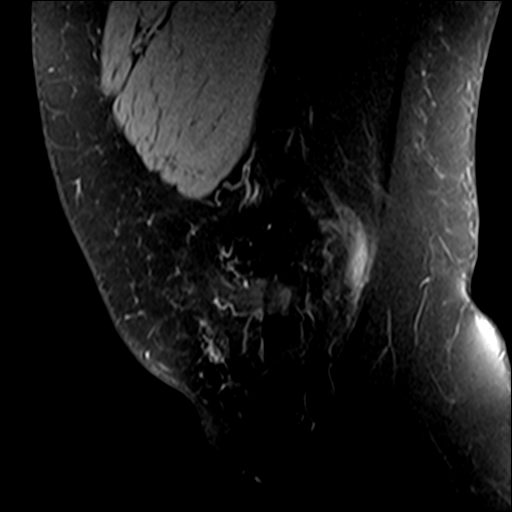
[im 27/27]
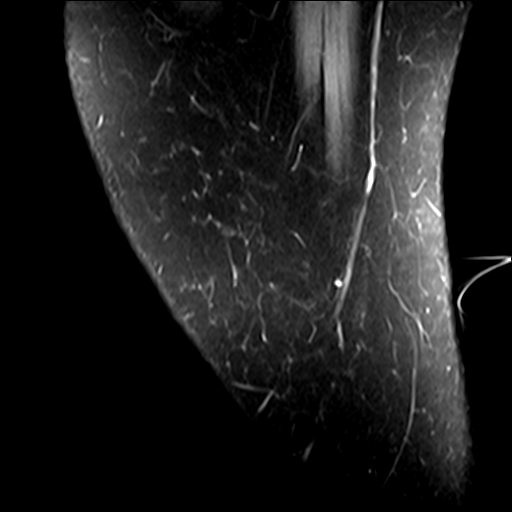

[Series 9: PD fat-sat · coronal · 2.0mm · 0.29mm/px · 3 of 11 slices shown (3 of 3)]
[im 1/11]
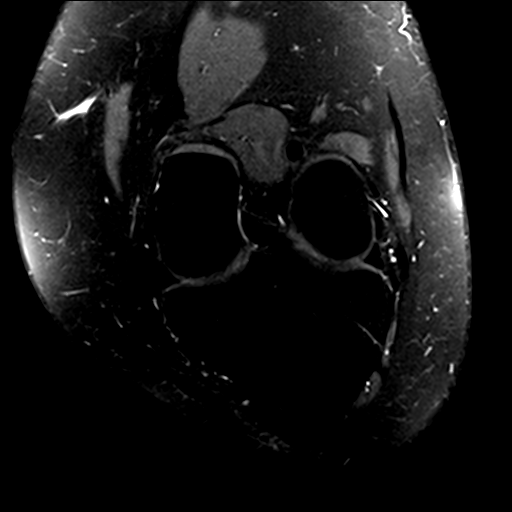
[im 6/11]
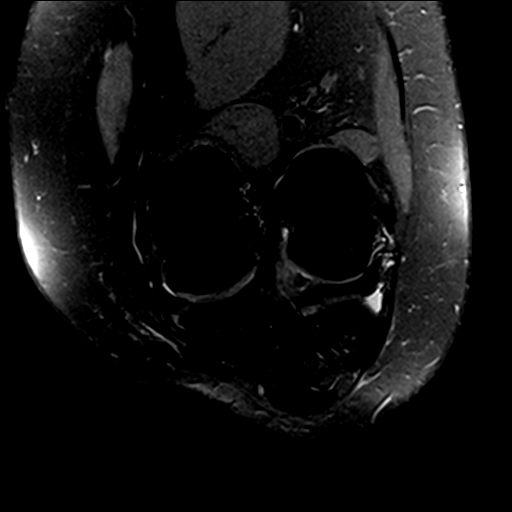
[im 11/11]
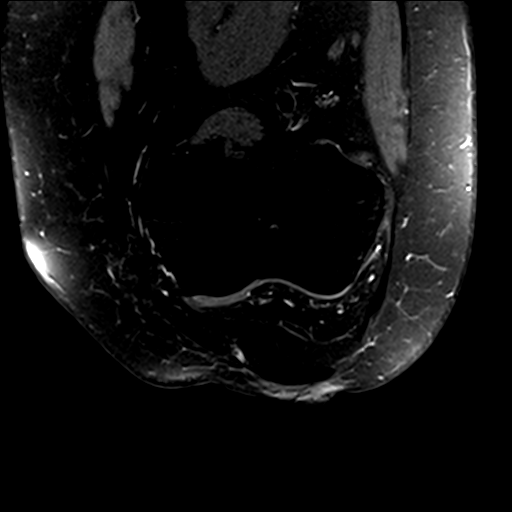

[22 of 40 positions shown; findings below may reference images not displayed]

FINDINGS: MENISCI

Medial meniscus: There is intermediate proton density signal
intrasubstance degeneration seen throughout the majority of the
posterior horn of the medial meniscus. There are thin linear areas
of this increased signal that appear to extend through the superior
articular surface of the peripheral third of the meniscal triangle
(sagittal series 8, image 19) and the inferior articular surface of
the peripheral third of the meniscal triangle (sagittal series 8,
images 19 and 20).

Lateral meniscus:  Intact.

LIGAMENTS

Cruciates: The ACL and PCL are intact.

Collaterals: The medial collateral ligament is intact. The fibular
collateral ligament, biceps femoris tendon, iliotibial band, and
popliteus tendon are intact.

CARTILAGE

Patellofemoral:  Intact.

Medial: Mild thinning of the mid to lateral aspect of the
weight-bearing medial femoral condyle cartilage.

Lateral:  Intact.

Joint: Nojoint effusion. Normal Hoffa's fat pad. No plical
thickening.

Popliteal Fossa:  No Baker's cyst.

Extensor Mechanism: The distal quadriceps tendon insertion is
intact. Mild proximal patellar intermediate T2 signal tendinosis.

Bones:  No acute fracture or dislocation.

Other: None.
IMPRESSION: :
IMPRESSION: 1. Very mild, tiny nondisplaced linear tears within the peripheral
aspect of the meniscal triangle of the posterior horn of the medial
meniscus, as above.
2. Mild proximal patellar tendinosis.

## 2021-07-04 NOTE — Progress Notes (Signed)
Does she have follow-up?

## 2021-07-06 NOTE — Progress Notes (Signed)
VM not set up, however patient is active in Mychart, so I scheduled the appt. ?

## 2021-07-12 ENCOUNTER — Ambulatory Visit: Payer: No Typology Code available for payment source | Admitting: Surgical

## 2021-07-27 ENCOUNTER — Ambulatory Visit: Payer: No Typology Code available for payment source | Admitting: Surgical

## 2021-08-03 ENCOUNTER — Ambulatory Visit (INDEPENDENT_AMBULATORY_CARE_PROVIDER_SITE_OTHER): Payer: No Typology Code available for payment source | Admitting: Surgical

## 2021-08-03 DIAGNOSIS — G8929 Other chronic pain: Secondary | ICD-10-CM | POA: Diagnosis not present

## 2021-08-03 DIAGNOSIS — M25562 Pain in left knee: Secondary | ICD-10-CM

## 2021-08-03 DIAGNOSIS — M25561 Pain in right knee: Secondary | ICD-10-CM

## 2021-08-08 ENCOUNTER — Encounter: Payer: Self-pay | Admitting: Surgical

## 2021-08-08 MED ORDER — LIDOCAINE HCL 1 % IJ SOLN
5.0000 mL | INTRAMUSCULAR | Status: AC | PRN
  Administered 2021-08-03: 5 mL

## 2021-08-08 MED ORDER — METHYLPREDNISOLONE ACETATE 40 MG/ML IJ SUSP
40.0000 mg | INTRAMUSCULAR | Status: AC | PRN
  Administered 2021-08-03: 40 mg via INTRA_ARTICULAR

## 2021-08-08 MED ORDER — BUPIVACAINE HCL 0.25 % IJ SOLN
4.0000 mL | INTRAMUSCULAR | Status: AC | PRN
  Administered 2021-08-03: 4 mL via INTRA_ARTICULAR

## 2021-08-08 NOTE — Progress Notes (Signed)
Office Visit Note   Patient: Kristen Fleming           Date of Birth: [birth year]           MRN: [id number] Visit Date: 08/03/2021 Requested by: Philip Aspen, Limmie Patricia, MD [address] [place],  [place] [address] PCP: Philip Aspen, Limmie Patricia, MD  Subjective: Chief Complaint  Patient presents with   Right Knee - Follow-up   Left Knee - Pain    HPI: Kristen Fleming is a 36 y.o. female who presents to the office for MRI review. Patient denies any changes in symptoms.  Continues to complain mainly of bilateral knee pain.  She has knee pain that waxes and wanes in intensity.  No history of injury.  She has had no change since seen Dr. August Saucer several weeks ago.  She is here today to review bilateral knee MRI scans.  She has tried physical therapy and NSAIDs with some relief.  Never had any prior knee surgery or knee injections.  She has no locking but does note some occasional clicking or popping sensation.  Both legs give out on her at times.  She also notes occasional radicular pain from her mid thigh to her calf.  Occasional ankle pain.  No low back pain.  She has numbness and tingling in the same distribution as her radicular pain.  Medical history significant for GERD.              ROS: All systems reviewed are negative as they relate to the chief complaint within the history of present illness.  Patient denies fevers or chills.  Assessment & Plan: Visit Diagnoses:  1. Medial joint line tenderness of knee, left   2. Chronic pain of both knees   3. Medial joint line tenderness of knee, right     Plan: [name] is a 36 y.o. female who presents to the office for evaluation of bilateral knee pain with MRI review.  She has MRI of the left knee demonstrating very tiny linear tears in the posterior horn of the medial meniscus with MRI of the right knee demonstrating mild tendinosis of the quadricep and patellar tendons.  Discussed the results and went over  the images with her in the clinic today.  With underwhelming MRI findings, plan to try bilateral knee injections to see how much relief she gets from these.  If she has substantial relief from the left knee injection, could consider arthroscopic debridement of the small meniscus tears but based on her numbness and tingling and some radicular pain she may be experiencing some referred pain from her L-spine.  Plan to check her back in 4 to 6 weeks with a repeat evaluation by Dr. August Saucer to see how much relief she got from bilateral knee injections administered today.  She tolerated both injections well.  Follow-Up Instructions: No follow-ups on file.   Orders:  No orders of the defined types were placed in this encounter.  No orders of the defined types were placed in this encounter.     Procedures: Large Joint Inj: bilateral knee on 08/03/2021 11:57 AM Indications: diagnostic evaluation, joint swelling and pain Details: 18 G 1.5 in needle, superolateral approach  Arthrogram: No  Medications (Right): 5 mL lidocaine 1 %; 4 mL bupivacaine 0.25 %; 40 mg methylPREDNISolone acetate 40 MG/ML Medications (Left): 5 mL lidocaine 1 %; 4 mL bupivacaine 0.25 %; 40 mg methylPREDNISolone acetate 40 MG/ML Outcome: tolerated well, no immediate complications Procedure,  treatment alternatives, risks and benefits explained, specific risks discussed. Consent was given by the patient. Immediately prior to procedure a time out was called to verify the correct patient, procedure, equipment, support staff and site/side marked as required. Patient was prepped and draped in the usual sterile fashion.      Clinical Data: No additional findings.  Objective: Vital Signs: There were no vitals taken for this visit.  Physical Exam:  Constitutional: Patient appears well-developed HEENT:  Head: Normocephalic Eyes:EOM are normal Neck: Normal range of motion Cardiovascular: Normal rate Pulmonary/chest: Effort  normal Neurologic: Patient is alert Skin: Skin is warm Psychiatric: Patient has normal mood and affect  Ortho Exam: Ortho exam demonstrates bilateral knees without effusion.  Tenderness over the medial joint line mildly of both knees without tenderness over the lateral joint line of either knee.  Able to perform straight leg raise without extensor lag in both legs.  Negative straight leg raise bilaterally.  5/5 motor strength of bilateral hip flexion, quadricep, hamstring, dorsiflexion, plantarflexion.  2+ patellar tendon reflexes of both legs.  No clonus noted in either leg.  No pain with hip range of motion.  Negative Stinchfield sign.  Stable to anterior and posterior drawer as well as varus and valgus stress at 0 and 30 degrees.  Negative Lachman exam bilaterally.  Range of motion from 0 degrees extension to about 125 degrees of knee flexion bilaterally.  Specialty Comments:  No specialty comments available.  Imaging: No results found.   PMFS History: Patient Active Problem List   Diagnosis Date Noted   Obesity (BMI 30.0-34.9)    Hypertension    Bilateral knee pain    PAIN IN JOINT, MULTIPLE SITES 08/23/2009   MENSTRUAL PAIN 07/15/2008   ECZEMA 07/15/2008   Past Medical History:  Diagnosis Date   Bilateral knee pain    Hypertension    Obesity (BMI 30.0-34.9)     Family History  Problem Relation Age of Onset   Hypertension Maternal Grandmother    Diabetes Maternal Grandmother    Breast cancer Paternal Grandmother 78    No past surgical history on file. Social History   Occupational History   Not on file  Tobacco Use   Smoking status: Never   Smokeless tobacco: Never  Vaping Use   Vaping Use: Never used  Substance and Sexual Activity   Alcohol use: Yes    Comment: occasional   Drug use: Never   Sexual activity: Not Currently    Birth control/protection: Implant    Comment: 1st intercourse 36 yo-Fewer than 5 partners

## 2021-09-05 ENCOUNTER — Ambulatory Visit: Payer: No Typology Code available for payment source | Admitting: Orthopedic Surgery

## 2021-09-12 ENCOUNTER — Encounter: Payer: Self-pay | Admitting: Orthopedic Surgery

## 2021-09-12 ENCOUNTER — Ambulatory Visit (INDEPENDENT_AMBULATORY_CARE_PROVIDER_SITE_OTHER): Payer: No Typology Code available for payment source | Admitting: Orthopedic Surgery

## 2021-09-12 ENCOUNTER — Ambulatory Visit (INDEPENDENT_AMBULATORY_CARE_PROVIDER_SITE_OTHER): Payer: No Typology Code available for payment source

## 2021-09-12 DIAGNOSIS — M79605 Pain in left leg: Secondary | ICD-10-CM | POA: Diagnosis not present

## 2021-09-12 DIAGNOSIS — M79604 Pain in right leg: Secondary | ICD-10-CM | POA: Diagnosis not present

## 2021-09-12 DIAGNOSIS — Z3042 Encounter for surveillance of injectable contraceptive: Secondary | ICD-10-CM | POA: Diagnosis not present

## 2021-09-12 MED ORDER — MEDROXYPROGESTERONE ACETATE 150 MG/ML IM SUSP
150.0000 mg | Freq: Once | INTRAMUSCULAR | Status: AC
  Administered 2021-09-12: 150 mg via INTRAMUSCULAR

## 2021-09-12 NOTE — Progress Notes (Signed)
Office Visit Note   Patient: Kristen Fleming           Date of Birth: 1985/12/10           MRN: [id number] Visit Date: 09/12/2021 Requested by: Philip Aspen, Limmie Patricia, MD [address] [place],  [place] [address] PCP: Philip Aspen, Limmie Patricia, MD  Subjective: Chief Complaint  Patient presents with   Follow-up    HPI: Kristen Fleming is a 36 year old patient with bilateral knee pain.  She had bilateral injections done 08/03/2021.  Right knee still feels good but the left knee has had some recurrent pain for the last 2 days.  MRI scans show no operative pathology present.  Takes ibuprofen with some relief.  She does have some sporadic episodic but fairly sharp back pain at times.  Had a history of having physical therapy when she was younger.  Pain is not constant.  Symptoms have been going on though now for over 6 to 8 weeks.              ROS: All systems reviewed are negative as they relate to the chief complaint within the history of present illness.  Patient denies  fevers or chills.   Assessment & Plan: Visit Diagnoses:  1. Bilateral leg pain     Plan: Impression is low back pain with possible radiation to the knees as a source for otherwise unexplained knee pain.  Due to duration of symptoms and somewhat enigmatic clinical presentation MRI scan of the back is indicated to evaluate for possible disc pathology.  If normal then the patient has no structural problem from the back or knees and likely has musculoskeletal symptoms which would have to be treated nonoperatively.  Follow-up with [place] after the study.  Plain radiographs of the lumbar spine unremarkable today.  Follow-Up Instructions: No follow-ups on file.   Orders:  Orders Placed This Encounter  Procedures   XR Lumbar Spine 2-3 Views   MR Lumbar Spine w/o contrast   No orders of the defined types were placed in this encounter.     Procedures: No procedures performed   Clinical Data: No additional  findings.  Objective: Vital Signs: There were no vitals taken for this visit.  Physical Exam:   Constitutional: Patient appears well-developed HEENT:  Head: Normocephalic Eyes:EOM are normal Neck: Normal range of motion Cardiovascular: Normal rate Pulmonary/chest: Effort normal Neurologic: Patient is alert Skin: Skin is warm Psychiatric: Patient has normal mood and affect   Ortho Exam: Ortho exam demonstrates no nerve root tension signs.  Normal gait and alignment.  No effusion in either knee.  Stable collateral cruciate ligaments.  No joint line tenderness.  Intact extensor mechanism which is nontender.  No groin pain with internal/external rotation of either leg.  No definite paresthesias L1 S1 bilaterally.  Mild pain with forward bending.  Specialty Comments:  No specialty comments available.  Imaging: No results found.   PMFS History: Patient Active Problem List   Diagnosis Date Noted   Obesity (BMI 30.0-34.9)    Hypertension    Bilateral knee pain    PAIN IN JOINT, MULTIPLE SITES 08/23/2009   MENSTRUAL PAIN 07/15/2008   ECZEMA 07/15/2008   Past Medical History:  Diagnosis Date   Bilateral knee pain    Hypertension    Obesity (BMI 30.0-34.9)     Family History  Problem Relation Age of Onset   Hypertension Maternal Grandmother    Diabetes Maternal Grandmother    Breast cancer Paternal  Grandmother 58    No past surgical history on file. Social History   Occupational History   Not on file  Tobacco Use   Smoking status: Never   Smokeless tobacco: Never  Vaping Use   Vaping Use: Never used  Substance and Sexual Activity   Alcohol use: Yes    Comment: occasional   Drug use: Never   Sexual activity: Not Currently    Birth control/protection: Implant    Comment: 1st intercourse 36 yo-Fewer than 5 partners

## 2021-09-24 ENCOUNTER — Ambulatory Visit
Admission: RE | Admit: 2021-09-24 | Discharge: 2021-09-24 | Disposition: A | Discharge status: Home / Self Care | Payer: No Typology Code available for payment source | Source: Ambulatory Visit | Attending: Orthopedic Surgery | Admitting: Orthopedic Surgery

## 2021-09-24 DIAGNOSIS — M79604 Pain in right leg: Secondary | ICD-10-CM

## 2021-10-03 ENCOUNTER — Ambulatory Visit: Payer: No Typology Code available for payment source | Admitting: Orthopedic Surgery

## 2021-10-15 ENCOUNTER — Ambulatory Visit (INDEPENDENT_AMBULATORY_CARE_PROVIDER_SITE_OTHER): Payer: No Typology Code available for payment source | Admitting: Surgical

## 2021-10-15 DIAGNOSIS — M79604 Pain in right leg: Secondary | ICD-10-CM

## 2021-10-15 DIAGNOSIS — M79605 Pain in left leg: Secondary | ICD-10-CM | POA: Diagnosis not present

## 2021-10-17 ENCOUNTER — Encounter: Payer: Self-pay | Admitting: Orthopedic Surgery

## 2021-10-17 NOTE — Progress Notes (Signed)
Office Visit Note   Patient: Kristen Fleming           Date of Birth: [birth year]           MRN: [id number] Visit Date: 10/15/2021 Requested by: Philip Aspen, Limmie Patricia, MD [address] [place],  [place] [address] PCP: Philip Aspen, Limmie Patricia, MD  Subjective: Chief Complaint  Patient presents with   Other     Scan review    HPI: Kristen Fleming is a 36 y.o. female who presents to the office for MRI review. Patient denies any changes in symptoms.  Continues to complain mainly of bilateral knee pain and low back pain.  Also describes occasional radicular pain down the lateral aspect of her thighs to the knees, more so on the left.  Denies any weakness.  She did have decent but incomplete pain relief from bilateral knee injections that she had several months ago.  MRI results revealed: MR Lumbar Spine w/o contrast  Result Date: 09/24/2021 CLINICAL DATA:  Bilateral leg pain. Evaluate for source of bilateral unexplained leg pain. Additional history provided by scanning technologist: Patient reports bilateral leg pain for several years, worsening. EXAM: MRI LUMBAR SPINE WITHOUT CONTRAST TECHNIQUE: Multiplanar, multisequence MR imaging of the lumbar spine was performed. No intravenous contrast was administered. COMPARISON:  Lumbar spine radiographs 09/12/2021 (images available, report unavailable). FINDINGS: Segmentation: The L5 vertebra is at least partly sacralized. A small disc space is present at L5-S1. Alignment: Slight grade 1 retrolisthesis at L1-L2, L2-L3, L3-L4 and L4-L5. Vertebrae: No lumbar vertebral compression fracture. Tiny Schmorl node within the L3 inferior endplate. No significant marrow edema or focal suspicious osseous lesion. Conus medullaris and cauda equina: Conus extends to the L1-L2 level. No signal abnormality identified within the visualized distal spinal cord. Paraspinal and other soft tissues: No abnormality identified within included portions of  the abdomen/retroperitoneum. No paraspinal mass or collection. Disc levels: No more than mild disc degeneration within the lumbar spine. T12-L1: No significant disc herniation or stenosis. L1-L2: Slight grade 1 retrolisthesis. No significant disc herniation or stenosis. L2-L3: Slight grade 1 retrolisthesis. Minimal facet arthrosis on the left. No significant spinal canal or foraminal stenosis. L3-L4: Slight grade 1 retrolisthesis. No significant disc herniation or stenosis. L4-L5: Slight grade 1 retrolisthesis. No significant disc herniation or stenosis. L5-S1: The L5 vertebra is at least partially sacralized. A small disc space is present at this level. No significant disc herniation or stenosis. IMPRESSION: The L5 vertebra is at least partially sacralized. A small disc space is present at the L5-S1 level. Careful attention to spinal numbering is recommended prior to any spinal intervention. Mild lumbar spondylosis, as outlined. No significant spinal canal or foraminal stenosis. Minimal grade 1 retrolisthesis at L1-L2, L2-L3, L3-L4 and L4-L5. Electronically Signed   By: Jackey Loge D.O.   On: 09/24/2021 18:45                 ROS: All systems reviewed are negative as they relate to the chief complaint within the history of present illness.  Patient denies fevers or chills.  Assessment & Plan: Visit Diagnoses:  1. Bilateral leg pain     Plan: Kristen Fleming is a 36 y.o. female who presents to the office for review of MRI lumbar spine.  No severe compressive stenosis in the spinal canal or foramen throughout lumbar spine.  May benefit from trying lumbar spine ESI despite the lack of significant pathology given the nature of some of her pain  and incomplete resolution with knee injections.  She would like to try lumbar spine ESI.  Plan to set her up with ESI with Dr. Alvester Morin.  Also plan to try bilateral knee and lumbar spine physical therapy with PT upstairs.  Follow-up as needed if no  improvement.  Follow-Up Instructions: No follow-ups on file.   Orders:  Orders Placed This Encounter  Procedures   Ambulatory referral to Physical Therapy   Ambulatory referral to Physical Medicine Rehab   No orders of the defined types were placed in this encounter.     Procedures: No procedures performed   Clinical Data: No additional findings.  Objective: Vital Signs: There were no vitals taken for this visit.  Physical Exam:  Constitutional: Patient appears well-developed HEENT:  Head: Normocephalic Eyes:EOM are normal Neck: Normal range of motion Cardiovascular: Normal rate Pulmonary/chest: Effort normal Neurologic: Patient is alert Skin: Skin is warm Psychiatric: Patient has normal mood and affect  Ortho Exam: No effusion in either knee.  Able to perform straight leg raise without extensor lag.  Excellent hip flexion, quadricep, hamstring, dorsiflexion, plantarflexion strength.  Negative straight leg raise bilaterally.  Specialty Comments:  No specialty comments available.  Imaging: No results found.   PMFS History: Patient Active Problem List   Diagnosis Date Noted   Obesity (BMI 30.0-34.9)    Hypertension    Bilateral knee pain    PAIN IN JOINT, MULTIPLE SITES 08/23/2009   MENSTRUAL PAIN 07/15/2008   ECZEMA 07/15/2008   Past Medical History:  Diagnosis Date   Bilateral knee pain    Hypertension    Obesity (BMI 30.0-34.9)     Family History  Problem Relation Age of Onset   Hypertension Maternal Grandmother    Diabetes Maternal Grandmother    Breast cancer Paternal Grandmother 64    No past surgical history on file. Social History   Occupational History   Not on file  Tobacco Use   Smoking status: Never   Smokeless tobacco: Never  Vaping Use   Vaping Use: Never used  Substance and Sexual Activity   Alcohol use: Yes    Comment: occasional   Drug use: Never   Sexual activity: Not Currently    Birth control/protection: Implant     Comment: 1st intercourse 36 yo-Fewer than 5 partners

## 2021-10-22 ENCOUNTER — Encounter: Payer: Self-pay | Admitting: Physical Medicine and Rehabilitation

## 2021-10-22 ENCOUNTER — Ambulatory Visit (INDEPENDENT_AMBULATORY_CARE_PROVIDER_SITE_OTHER): Payer: No Typology Code available for payment source | Admitting: Physical Medicine and Rehabilitation

## 2021-10-22 VITALS — BP 96/61 | HR 60

## 2021-10-22 DIAGNOSIS — M5416 Radiculopathy, lumbar region: Secondary | ICD-10-CM

## 2021-10-22 DIAGNOSIS — M5441 Lumbago with sciatica, right side: Secondary | ICD-10-CM

## 2021-10-22 DIAGNOSIS — M4726 Other spondylosis with radiculopathy, lumbar region: Secondary | ICD-10-CM | POA: Diagnosis not present

## 2021-10-22 DIAGNOSIS — M4310 Spondylolisthesis, site unspecified: Secondary | ICD-10-CM

## 2021-10-22 DIAGNOSIS — G8929 Other chronic pain: Secondary | ICD-10-CM

## 2021-10-22 DIAGNOSIS — M7918 Myalgia, other site: Secondary | ICD-10-CM

## 2021-10-22 DIAGNOSIS — M5442 Lumbago with sciatica, left side: Secondary | ICD-10-CM

## 2021-10-22 MED ORDER — METHOCARBAMOL 500 MG PO TABS: 500.0000 mg | ORAL_TABLET | Freq: Three times a day (TID) | ORAL | 0 refills | Status: DC

## 2021-10-22 NOTE — Progress Notes (Unsigned)
Pt state lower back pain that travels down both legs, mostly her left side. Pt state walking and standing makes the pain worse. Pt state she takes over the counter pain meds and uses heat to help ease her pain.  Numeric Pain Rating Scale and Functional Assessment Average Pain 10 Pain Right Now 8 My pain is constant, sharp, stabbing, tingling, and aching Pain is worse with: walking, bending, sitting, standing, and some activites Pain improves with: heat/ice and medication   In the last MONTH (on 0-10 scale) has pain interfered with the following?  1. General activity like being  able to carry out your everyday physical activities such as walking, climbing stairs, carrying groceries, or moving a chair?  Rating(5)  2. Relation with others like being able to carry out your usual social activities and roles such as  activities at home, at work and in your community. Rating(6)  3. Enjoyment of life such that you have  been bothered by emotional problems such as feeling anxious, depressed or irritable?  Rating(7)

## 2021-10-22 NOTE — Progress Notes (Unsigned)
Kristen Fleming - 36 y.o. female MRN [id number]  Date of birth: 1985-12-01  Office Visit Note: Visit Date: 10/22/2021 PCP: Philip Aspen, Limmie Patricia, MD Referred by: Philip Aspen, Estel*  Subjective: Chief Complaint  Patient presents with   Lower Back - Pain   Right Leg - Pain   Left Leg - Pain   HPI: Kristen Fleming is a 35 y.o. female who comes in today per the request of Dr. Dorene Grebe for evaluation of chronic, worsening and severe bilateral lower back pain with referral of pain to lateral thighs down to knees. Pain ongoing for 2-3 years intermittently and is exacerbated by movement and activity. She describes pain as sore, aching and sharp in nature. States she has episodes of sharp pain localized to bilateral lower back that does improve after exercise. Patient states some relief of pain with home exercise regimen, rest, heating pad and use of Ibuprofen. Patients recent lumbar MRI imaging exhibits partially sacralized L5 vertebrae, minimal grade 1 retrolisthesis at L1-L2, L2-L3, L3-L4 and L4-L5. No high grade spinal canal stenosis noted. Patient was recently seen by Dr. August Saucer and did undergo bilateral knee injections on 08/03/2021 with some relief of pain. Patient states she is scheduled to start formal physical therapy with our in house team on 10/30/21. 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 Patient denies focal weakness, numbness and tingling. Patient denies recent trauma or falls.    Review of Systems  Musculoskeletal:  Positive for back pain and myalgias.  Neurological:  Negative for tingling, sensory change, focal weakness and weakness.  All other systems reviewed and are negative.  Otherwise per HPI.  Assessment & Plan: Visit Diagnoses:    ICD-10-CM   1. Lumbar radiculopathy  M54.16     2. Chronic bilateral low back pain with bilateral sciatica  M54.42    M54.41    G89.29      3. Other spondylosis with radiculopathy, lumbar region  M47.26     4. Retrolisthesis  M43.10     5. Myofascial pain syndrome  M79.18        Plan: Findings:  Chronic, worsening and severe bilateral lower back pain with referral of pain to lateral thighs down to knees. Patient continues to have severe pain despite good conservative therapies such as home exercise regimen, heating pad, rest and Ibuprofen. Patients clinical presentation and exam are complex, differentials could include lumbar radiculopathy and myofascial pain syndrome. She does have tenderness and multiple palpable trigger points to bilateral lumbar paraspinal regions upon exam today.  We discussed lumbar epidural steroid injection procedure and possibility of performing injection. Patient would like to proceed with physical therapy before injection. I do feel she could benefit from manual treatments and dry needling. I did discuss medication management with her and prescribed Meloxicam to take during the day. I would like for patient to follow up with me in approximately 6 weeks for re-evaluation. If her pain persists I believe the next step is to perform a diagnostic lumbar epidural steroid injection. No red flag symptoms noted upon exam today.     Meds & Orders:  Meds ordered this encounter  Medications   methocarbamol (ROBAXIN) 500 MG tablet    Sig: Take 1 tablet (500 mg total) by mouth 3 (three) times daily.    Dispense:  90 tablet    Refill:  0   No orders of the defined types were  placed in this encounter.   Follow-up: Return for 6 week follow up for re-evaluation.   Procedures: No procedures performed      Clinical History: MRI LUMBAR SPINE WITHOUT CONTRAST   TECHNIQUE: Multiplanar, multisequence MR imaging of the lumbar spine was performed. No intravenous contrast was administered.   COMPARISON:  Lumbar spine radiographs 09/12/2021 (images available, report unavailable).   FINDINGS: Segmentation: The L5  vertebra is at least partly sacralized. A small disc space is present at L5-S1.   Alignment: Slight grade 1 retrolisthesis at L1-L2, L2-L3, L3-L4 and L4-L5.   Vertebrae: No lumbar vertebral compression fracture. Tiny Schmorl node within the L3 inferior endplate. No significant marrow edema or focal suspicious osseous lesion.   Conus medullaris and cauda equina: Conus extends to the L1-L2 level. No signal abnormality identified within the visualized distal spinal cord.   Paraspinal and other soft tissues: No abnormality identified within included portions of the abdomen/retroperitoneum. No paraspinal mass or collection.   Disc levels:   No more than mild disc degeneration within the lumbar spine.   T12-L1: No significant disc herniation or stenosis.   L1-L2: Slight grade 1 retrolisthesis. No significant disc herniation or stenosis.   L2-L3: Slight grade 1 retrolisthesis. Minimal facet arthrosis on the left. No significant spinal canal or foraminal stenosis.   L3-L4: Slight grade 1 retrolisthesis. No significant disc herniation or stenosis.   L4-L5: Slight grade 1 retrolisthesis. No significant disc herniation or stenosis.   L5-S1: The L5 vertebra is at least partially sacralized. A small disc space is present at this level. No significant disc herniation or stenosis.   IMPRESSION: The L5 vertebra is at least partially sacralized. A small disc space is present at the L5-S1 level. Careful attention to spinal numbering is recommended prior to any spinal intervention.   Mild lumbar spondylosis, as outlined. No significant spinal canal or foraminal stenosis.   Minimal grade 1 retrolisthesis at L1-L2, L2-L3, L3-L4 and L4-L5.     Electronically Signed   By: Jackey Loge D.O.   On: 09/24/2021 18:45   She reports that she has never smoked. She has never used smokeless tobacco. No results for input(s): "HGBA1C", "LABURIC" in the last 8760 hours.  Objective:  VS:  HT:     WT:   BMI:     BP:96/61  HR:60bpm  TEMP: ( )  RESP:  Physical Exam Vitals and nursing note reviewed.  HENT:     Head: Normocephalic and atraumatic.     Right Ear: External ear normal.     Left Ear: External ear normal.     Nose: Nose normal.     Mouth/Throat:     Mouth: Mucous membranes are moist.  Eyes:     Pupils: Pupils are equal, round, and reactive to light.  Cardiovascular:     Rate and Rhythm: Normal rate.     Pulses: Normal pulses.  Pulmonary:     Effort: Pulmonary effort is normal.  Abdominal:     General: Abdomen is flat. There is no distension.  Musculoskeletal:        General: Tenderness present.     Cervical back: Normal range of motion.     Comments: Pt rises from seated position to standing without difficulty. Good lumbar range of motion. Strong distal strength without clonus, no pain upon palpation of greater trochanters. Sensation intact bilaterally. Multiple palpable trigger points noted to bilateral lumbar paraspinal region. Walks independently, gait steady.   Skin:    General: Skin is  warm and dry.     Capillary Refill: Capillary refill takes less than 2 seconds.  Neurological:     General: No focal deficit present.     Mental Status: She is alert and oriented to person, place, and time.  Psychiatric:        Mood and Affect: Mood normal.        Behavior: Behavior normal.     Ortho Exam  Imaging: No results found.  Past Medical/Family/Surgical/Social History: Medications & Allergies reviewed per EMR, new medications updated. Patient Active Problem List   Diagnosis Date Noted   Obesity (BMI 30.0-34.9)    Hypertension    Bilateral knee pain    PAIN IN JOINT, MULTIPLE SITES 08/23/2009   MENSTRUAL PAIN 07/15/2008   ECZEMA 07/15/2008   Past Medical History:  Diagnosis Date   Bilateral knee pain    Hypertension    Obesity (BMI 30.0-34.9)    Family History  Problem Relation Age of Onset   Hypertension Maternal Grandmother    Diabetes  Maternal Grandmother    Breast cancer Paternal Grandmother 8   History reviewed. No pertinent surgical history. Social History   Occupational History   Not on file  Tobacco Use   Smoking status: Never   Smokeless tobacco: Never  Vaping Use   Vaping Use: Never used  Substance and Sexual Activity   Alcohol use: Yes    Comment: occasional   Drug use: Never   Sexual activity: Not Currently    Birth control/protection: Implant    Comment: 1st intercourse 36 yo-Fewer than 5 partners

## 2021-10-23 NOTE — Therapy (Signed)
OUTPATIENT PHYSICAL THERAPY EVALUATION   Patient Name: Kristen Fleming MRN: [id number] DOB:[birth year], 36 y.o., female Today's Date: 10/30/2021   PT End of Session - 10/30/21 0959     Visit Number 1    Number of Visits 20    Date for PT Re-Evaluation 01/08/22    Authorization Type [facility] $40 copay    PT Start Time 1004    PT Stop Time 1052    PT Time Calculation (min) 48 min    Activity Tolerance Patient tolerated treatment well    Behavior During Therapy WFL for tasks assessed/performed             Past Medical History:  Diagnosis Date   Bilateral knee pain    Hypertension    Obesity (BMI 30.0-34.9)    History reviewed. No pertinent surgical history. Patient Active Problem List   Diagnosis Date Noted   Obesity (BMI 30.0-34.9)    Hypertension    Bilateral knee pain    PAIN IN JOINT, MULTIPLE SITES 08/23/2009   MENSTRUAL PAIN 07/15/2008   ECZEMA 07/15/2008    PCP: Philip Aspen, Limmie Patricia, MD  REFERRING PROVIDER: Cammy Copa, MD  REFERRING DIAG: M79.604,M79.605 (ICD-10-CM) - Bilateral leg pain  Rationale for Evaluation and Treatment Rehabilitation  THERAPY DIAG:  Other low back pain  Chronic pain of left knee  Chronic pain of right knee  Muscle weakness (generalized)  Abnormal posture  Difficulty in walking, not elsewhere classified  ONSET DATE: 2 years +  SUBJECTIVE:                                                                                                                                                                                           SUBJECTIVE STATEMENT: Pt reported complaints of bilateral knee pains.  History of using braces to try to help.  Reported having some swelling in ankles at times.  Denied any formal treatment for condition.  Complaints of acute on chronic exacerbation with no MOI indicated for complaints of low back, bilateral leg symptoms.  PERTINENT HISTORY:  Referral for bilateral leg pain,  lumbar spine complaints.   Acute on chronic - history of several years.   PAIN:  NPRS scale: at current 6/10  , at worst 10/10, at best 0/10 Pain location: back bilateral, into thighs Pain description: tight pressure, numbness/tingling in thighs to knees (posterior/lateral) Aggravating factors: sitting/standing prolonged Relieving factors: heat, OTC medicine, rest  NPRS scale: at current 4/10 , at worst 10/10, at best 0/10 Pain location: knees bilateral, Lt worse at times Pain description: throbbing Aggravating factors: walking at times, stairs, squats  Relieving factors: rubbing  knees, rest from pain activity, OTC   PRECAUTIONS: None  WEIGHT BEARING RESTRICTIONS No  FALLS:  Has patient fallen in last 6 months? No  LIVING ENVIRONMENT: Lives in: House/apartment Stairs:  multilevel house c stairs to bedroom with rail on Lt going up.    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  For knees:  reduce pain.    OBJECTIVE:   Imaging Review:   10/30/2021 eval review:  recent lumbar MRI imaging exhibits partially sacralized L5 vertebrae, minimal grade 1 retrolisthesis at L1-L2, L2-L3, L3-L4 and L4-L5. No high grade spinal canal stenosis noted.  PATIENT SURVEYS:  10/30/2021 Knee based FOTO intake: 38   predicted:  64  SCREENING FOR RED FLAGS: 10/30/2021 Bowel or bladder incontinence: No Cauda equina syndrome: No  COGNITION: 10/30/2021  Overall cognitive status: WFL normal    SENSATION: 10/30/2021 WFL today light touch  MUSCLE LENGTH: 10/30/2021 Supine passive slr Lt 90 deg without back pain, supine passive SLR Rt 88 deg without back pain  POSTURE:  10/30/2021  Mild increase in observed lumbar  lordosis in standing, equal iliac crest, no observed lateral shift  PALPATION: 10/30/2021 Lt lumbar tenderness (QL, paraspinals).   LUMBAR ROM:   10/30/2021 eval:  no centralization/peripheral noted,  no instances of numbness noted during any time in visit today Active  AROM  10/30/2021  Flexion To toes, painful arc upon return.   Extension 75% c ERP lumbar no leg symptoms  Repeated x 5 c increased pain after, 100 % WFL c ERP noted Lt lumbar    Right lateral flexion To head of fibula no complaints  Left lateral flexion To head of fibula no complaints  Right rotation   Left rotation    (Blank rows = not tested)  LOWER EXTREMITY ROM:     AROM Right 10/30/2021 Left 10/30/2021  Hip flexion    Hip extension    Hip abduction    Hip adduction    Hip internal rotation    Hip external rotation    Knee flexion 123 c end range pain In supine heel slide 124 c end range pain In supine heel slide  Knee extension 0 in seated LAQ 0 in seated LAQ  Ankle dorsiflexion    Ankle plantarflexion    Ankle inversion    Ankle eversion     (Blank rows = not tested)  LOWER EXTREMITY MMT:    MMT Right 10/30/2021 Left 10/30/2021  Hip flexion 5/5 5/5  Hip extension    Hip abduction    Hip adduction    Hip internal rotation    Hip external rotation    Knee flexion 5/5 5/5  Knee extension 5/5 4/5 c mild anterior knee pain  Ankle dorsiflexion 5/5 5/5  Ankle plantarflexion    Ankle inversion    Ankle eversion     (Blank rows = not tested)  LUMBAR SPECIAL TESTS:  10/30/2021 (-) slump bilateral, (-) crossed slr bilateral  FUNCTIONAL TESTS:  10/30/2021 18 inch chair sit to stand:  able without UE assist, fair control in lowering.  Lt SLS:  7 seconds  Rt SLS:  8 seconds - fair control noted during testing.   GAIT: 10/30/2021 Independent ambulation into  clinic c no obvious deviation today.     TODAY'S TREATMENT  10/30/2021 Therex:    HEP instruction/performance c cues for techniques,  handout provided.  Trial set performed of each for comprehension and symptom assessment.  See below for exercise list    PATIENT EDUCATION:  10/30/2021 Education details: HEP, POC Person educated: Patient Education method: Explanation, Demonstration, Verbal cues, and Handouts Education comprehension: verbalized understanding, returned demonstration, and verbal cues required    HOME EXERCISE PROGRAM: Access Code: [email or web address] URL: [email or web address] Date: 10/30/2021 Prepared by: Chyrel Masson  Exercises - Supine Lower Trunk Rotation  - 2-3 x daily - 7 x weekly - 1 sets - 3-5 reps - 15 hold - Supine Pelvic Tilt with Straight Leg Raise  - 1-2 x daily - 7 x weekly - 1-2 sets - 10-15 reps - Seated Straight Leg Heel Taps  - 1-2 x daily - 7 x weekly - 1-2 sets - 10-15 reps - Supine Bridge  - 1-2 x daily - 7 x weekly - 1-2 sets - 10 reps - 3-5 hold - Supine Single Knee to Chest Stretch (Mirrored)  - 2 x daily - 7 x weekly - 1 sets - 5 reps - 15 hold - Sit to Stand  - 1 x daily - 7 x weekly - 3 sets - 10 reps  ASSESSMENT:  CLINICAL IMPRESSION: Patient is a 36 y.o. who comes to clinic with complaints of bilateral knee pain with mobility, strength and movement coordination deficits as well as chronic low back pain c movement coordination deficits primary that impair their ability to perform usual daily and recreational functional activities without increase difficulty/symptoms at this time.  Patient to benefit from skilled PT services to address impairments and limitations to improve to previous level of function without restriction secondary to condition.     OBJECTIVE IMPAIRMENTS decreased activity tolerance, decreased balance, decreased coordination, decreased endurance, decreased mobility, difficulty walking, decreased ROM, decreased strength, hypomobility, increased fascial restrictions, impaired perceived functional ability, increased muscle spasms, impaired flexibility,  improper body mechanics, postural dysfunction, and pain.   ACTIVITY LIMITATIONS carrying, lifting, bending, sitting, standing, squatting, stairs, transfers, and locomotion level  PARTICIPATION LIMITATIONS: cleaning, laundry, interpersonal relationship, shopping, community activity, and yard work  PERSONAL FACTORS  Multiple treatment areas, HTN, obesity, length of time since onset  are also affecting patient's functional outcome.   REHAB POTENTIAL: Good  CLINICAL DECISION MAKING: Stable/uncomplicated  EVALUATION COMPLEXITY: Low   GOALS: Goals reviewed with patient? Yes  Short term PT Goals (target date for Short term goals are 3 weeks 11/20/2021) Patient will demonstrate independent use of home exercise program to maintain progress from in clinic treatments. Goal status: New   Long term PT goals (target dates for all long term goals are 10 weeks  01/08/2022 )   1. Patient will demonstrate/report pain at worst less than or equal to 2/10 to facilitate minimal limitation in daily activity secondary to pain symptoms. Goal status: New   2. Patient will demonstrate independent use of home exercise program to facilitate ability to maintain/progress functional gains from skilled physical therapy services. Goal status: New   3. Patient will demonstrate FOTO outcome > or = 64 % to indicate reduced disability due to condition. Goal status: New   4. Patient will demonstrate lumbar extension 100 % WFL s symptoms to facilitate upright standing, walking posture at PLOF s limitation. Goal status: New   5.  Patient will demonstrate bilateral lower extremity MMT 5/5 throughout to faciltiate usual transfers,  stairs, squatting at PLOF for daily life.     Goal status: New   6.  Patient will demonstrate bilateral knee AROM 0-125 s symptoms for normal mobility at PLOF.   Goal status: New   7.  Patient will demonstrate bilateral SLS control > 15 seconds s deviation to facilitate stability in  ambulation on even and uneven surfaces.  a.  Goal Status: New 8.  Patient will demonstrate/report reciprocal gait pattern on stairs with one handrail assist s symptoms for household navigation.    a. Goal status: new    PLAN: PT FREQUENCY: 1-2x/week  PT DURATION: 10 weeks  PLANNED INTERVENTIONS: Therapeutic exercises, Therapeutic activity, Neuro Muscular re-education, Balance training, Gait training, Patient/Family education, Joint mobilization, Stair training, DME instructions, Dry Needling, Electrical stimulation, Cryotherapy, Moist heat, Taping, Ultrasound, Ionotophoresis 4mg /ml Dexamethasone, and Manual therapy.  All included unless contraindicated   PLAN FOR NEXT SESSION: Review HEP knowledge/results.   Movement coordination core focus, possible Rt QL directed manual therapy.  Progressive strengthening for quad in open and closed chain.   , PT, DPT, OCS, ATC 10/30/21  10:59 AM

## 2021-10-30 ENCOUNTER — Encounter: Payer: Self-pay | Admitting: Rehabilitative and Restorative Service Providers"

## 2021-10-30 ENCOUNTER — Ambulatory Visit (INDEPENDENT_AMBULATORY_CARE_PROVIDER_SITE_OTHER): Payer: No Typology Code available for payment source | Admitting: Rehabilitative and Restorative Service Providers"

## 2021-10-30 ENCOUNTER — Other Ambulatory Visit: Payer: Self-pay

## 2021-10-30 DIAGNOSIS — R293 Abnormal posture: Secondary | ICD-10-CM

## 2021-10-30 DIAGNOSIS — M25562 Pain in left knee: Secondary | ICD-10-CM | POA: Diagnosis not present

## 2021-10-30 DIAGNOSIS — M25561 Pain in right knee: Secondary | ICD-10-CM | POA: Diagnosis not present

## 2021-10-30 DIAGNOSIS — R262 Difficulty in walking, not elsewhere classified: Secondary | ICD-10-CM

## 2021-10-30 DIAGNOSIS — M6281 Muscle weakness (generalized): Secondary | ICD-10-CM

## 2021-10-30 DIAGNOSIS — M5459 Other low back pain: Secondary | ICD-10-CM | POA: Diagnosis not present

## 2021-10-30 DIAGNOSIS — G8929 Other chronic pain: Secondary | ICD-10-CM

## 2021-11-05 ENCOUNTER — Encounter: Payer: Self-pay | Admitting: Physical Therapy

## 2021-11-05 ENCOUNTER — Ambulatory Visit (INDEPENDENT_AMBULATORY_CARE_PROVIDER_SITE_OTHER): Payer: No Typology Code available for payment source | Admitting: Physical Therapy

## 2021-11-05 DIAGNOSIS — M6281 Muscle weakness (generalized): Secondary | ICD-10-CM

## 2021-11-05 DIAGNOSIS — M25562 Pain in left knee: Secondary | ICD-10-CM

## 2021-11-05 DIAGNOSIS — R293 Abnormal posture: Secondary | ICD-10-CM

## 2021-11-05 DIAGNOSIS — M25561 Pain in right knee: Secondary | ICD-10-CM | POA: Diagnosis not present

## 2021-11-05 DIAGNOSIS — G8929 Other chronic pain: Secondary | ICD-10-CM

## 2021-11-05 DIAGNOSIS — M5459 Other low back pain: Secondary | ICD-10-CM | POA: Diagnosis not present

## 2021-11-05 DIAGNOSIS — R262 Difficulty in walking, not elsewhere classified: Secondary | ICD-10-CM

## 2021-11-05 NOTE — Therapy (Addendum)
OUTPATIENT PHYSICAL THERAPY TREATMENT   Patient Name: Kristen Fleming MRN: [id number] DOB:10-28-1985, 36 y.o., female Today's Date: 11/05/2021   PT End of Session - 11/05/21 0833     Visit Number 2    Number of Visits 20    Date for PT Re-Evaluation 01/08/22    Authorization Type [facility] $40 copay    PT Start Time 0804    PT Stop Time 0842    PT Time Calculation (min) 38 min    Activity Tolerance Patient tolerated treatment well    Behavior During Therapy WFL for tasks assessed/performed              Past Medical History:  Diagnosis Date   Bilateral knee pain    Hypertension    Obesity (BMI 30.0-34.9)    History reviewed. No pertinent surgical history. Patient Active Problem List   Diagnosis Date Noted   Obesity (BMI 30.0-34.9)    Hypertension    Bilateral knee pain    PAIN IN JOINT, MULTIPLE SITES 08/23/2009   MENSTRUAL PAIN 07/15/2008   ECZEMA 07/15/2008    PCP: Philip Aspen, Limmie Patricia, MD  REFERRING PROVIDER: Cammy Copa, MD  REFERRING DIAG: M79.604,M79.605 (ICD-10-CM) - Bilateral leg pain  Rationale for Evaluation and Treatment Rehabilitation  THERAPY DIAG:  Chronic pain of left knee  Chronic pain of right knee  Other low back pain  Muscle weakness (generalized)  Abnormal posture  Difficulty in walking, not elsewhere classified  ONSET DATE: 2 years +  SUBJECTIVE:                                                                                                                                                                                           SUBJECTIVE STATEMENT: Pt arriving today reporting bil low back pain 8/10. Pt stating her HEP is going well.   PERTINENT HISTORY:  Referral for bilateral leg pain, lumbar spine complaints.   Acute on chronic - history of several years.   PAIN:  NPRS scale: at current /10  , at worst 10/10, at best 0/10 Pain location: back bilateral, into thighs Pain description: tight  pressure, numbness/tingling in thighs to knees (posterior/lateral) Aggravating factors: sitting/standing prolonged Relieving factors: heat, OTC medicine, rest  NPRS scale: at current 4/10 , at worst 10/10, at best 0/10 Pain location: knees bilateral, Lt worse at times Pain description: throbbing Aggravating factors: walking at times, stairs, squats  Relieving factors: rubbing knees, rest from pain activity, OTC   PRECAUTIONS: None  WEIGHT BEARING RESTRICTIONS No  FALLS:  Has patient fallen in last 6 months? No  LIVING ENVIRONMENT: Lives in: House/apartment Stairs:  multilevel house c stairs to bedroom with rail on Lt going up.    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  For knees:  reduce pain.    OBJECTIVE:   Imaging Review:   10/30/2021 eval review:  recent lumbar MRI imaging exhibits partially sacralized L5 vertebrae, minimal grade 1 retrolisthesis at L1-L2, L2-L3, L3-L4 and L4-L5. No high grade spinal canal stenosis noted.  PATIENT SURVEYS:  10/30/2021 Knee based FOTO intake: 38   predicted:  64  SCREENING FOR RED FLAGS: 10/30/2021 Bowel or bladder incontinence: No Cauda equina syndrome: No  COGNITION: 10/30/2021  Overall cognitive status: WFL normal    SENSATION: 10/30/2021 WFL today light touch  MUSCLE LENGTH: 10/30/2021 Supine passive slr Lt 90 deg without back pain, supine passive SLR Rt 88 deg without back pain  POSTURE:  10/30/2021  Mild increase in observed lumbar lordosis in standing, equal iliac crest, no observed lateral shift  PALPATION: 10/30/2021 Lt lumbar tenderness (QL, paraspinals).   LUMBAR ROM:   10/30/2021 eval:  no centralization/peripheral noted,  no  instances of numbness noted during any time in visit today Active  AROM  10/30/2021  Flexion To toes, painful arc upon return.   Extension 75% c ERP lumbar no leg symptoms  Repeated x 5 c increased pain after, 100 % WFL c ERP noted Lt lumbar    Right lateral flexion To head of fibula no complaints  Left lateral flexion To head of fibula no complaints  Right rotation   Left rotation    (Blank rows = not tested)  LOWER EXTREMITY ROM:     AROM Right 10/30/2021 Left 10/30/2021  Hip flexion    Hip extension    Hip abduction    Hip adduction    Hip internal rotation    Hip external rotation    Knee flexion 123 c end range pain In supine heel slide 124 c end range pain In supine heel slide  Knee extension 0 in seated LAQ 0 in seated LAQ  Ankle dorsiflexion    Ankle plantarflexion    Ankle inversion    Ankle eversion     (Blank rows = not tested)  LOWER EXTREMITY MMT:    MMT Right 10/30/2021 Left 10/30/2021  Hip flexion 5/5 5/5  Hip extension    Hip abduction    Hip adduction    Hip internal rotation    Hip external rotation    Knee flexion 5/5 5/5  Knee extension 5/5 4/5 c mild anterior knee pain  Ankle dorsiflexion 5/5 5/5  Ankle plantarflexion    Ankle inversion    Ankle eversion     (Blank rows = not tested)  LUMBAR SPECIAL TESTS:  10/30/2021 (-) slump bilateral, (-) crossed slr bilateral  FUNCTIONAL TESTS:  10/30/2021 18 inch chair sit to stand:  able without UE assist, fair control in lowering.  Lt SLS:  7 seconds  Rt SLS:  8 seconds - fair control noted during testing.   GAIT: 10/30/2021 Independent ambulation into clinic c no obvious deviation today.     TODAY'S TREATMENT  11/05/2021:  TherEx:  Supine STKC x 3 holding 20 sec Bridges: 2 x 10 holding 5 sec Supine SLR: x  10 bil LE's  Supine trunk rotation: x 2 holding 20 sec Child's pose: x 3 holding 20 seconds Cat/Camel: x 5 holding 10 sec Standing extension c elbows on wall x 3 holding 10  seconds Standing Rows: Level 3 band 2 x 15  Standing hip extension 2 x 10 Standing mini squats in parallel bars x 10 c UE support Leg Press 75# 2 x 15 bil LE's Modalities:  Moist heat: x 4 minutes to low back in seated position     10/30/2021 Therex:    HEP instruction/performance c cues for techniques, handout provided.  Trial set performed of each for comprehension and symptom assessment.  See below for exercise list    PATIENT EDUCATION:  10/30/2021 Education details: HEP, POC Person educated: Patient Education method: Explanation, Demonstration, Verbal cues, and Handouts Education comprehension: verbalized understanding, returned demonstration, and verbal cues required    HOME EXERCISE PROGRAM: Access Code: [email or web address] URL: [email or web address] Date: 10/30/2021 Prepared by: Chyrel Masson  Exercises - Supine Lower Trunk Rotation  - 2-3 x daily - 7 x weekly - 1 sets - 3-5 reps - 15 hold - Supine Pelvic Tilt with Straight Leg Raise  - 1-2 x daily - 7 x weekly - 1-2 sets - 10-15 reps - Seated Straight Leg Heel Taps  - 1-2 x daily - 7 x weekly - 1-2 sets - 10-15 reps - Supine Bridge  - 1-2 x daily - 7 x weekly - 1-2 sets - 10 reps - 3-5 hold - Supine Single Knee to Chest Stretch (Mirrored)  - 2 x daily - 7 x weekly - 1 sets - 5 reps - 15 hold - Sit to Stand  - 1 x daily - 7 x weekly - 3 sets - 10 reps  ASSESSMENT:  CLINICAL IMPRESSION: Pt arriving today reporting 8/10 low back pain bilaterally. Pt stating her HEP has been going well. HEP reviewed during session. Pt tolerating exercises well with no reports of increased pain during session. Pt needing cuing for core activation and breathing during exercises. Continue skilled PT to maximize pt's function.     OBJECTIVE IMPAIRMENTS decreased activity tolerance, decreased balance, decreased coordination, decreased endurance, decreased mobility, difficulty walking, decreased ROM, decreased strength, hypomobility,  increased fascial restrictions, impaired perceived functional ability, increased muscle spasms, impaired flexibility, improper body mechanics, postural dysfunction, and pain.   ACTIVITY LIMITATIONS carrying, lifting, bending, sitting, standing, squatting, stairs, transfers, and locomotion level  PARTICIPATION LIMITATIONS: cleaning, laundry, interpersonal relationship, shopping, community activity, and yard work  PERSONAL FACTORS  Multiple treatment areas, HTN, obesity, length of time since onset  are also affecting patient's functional outcome.   REHAB POTENTIAL: Good  CLINICAL DECISION MAKING: Stable/uncomplicated  EVALUATION COMPLEXITY: Low   GOALS: Goals reviewed with patient? Yes  Short term PT Goals (target date for Short term goals are 3 weeks 11/20/2021) Patient will demonstrate independent use of home exercise program to maintain progress from in clinic treatments. Goal status: On-going 11/05/21   Long term PT goals (target dates for all long term goals are 10 weeks  01/08/2022 )   1. Patient will demonstrate/report pain at worst less than or equal to 2/10 to facilitate minimal limitation in daily activity secondary to pain symptoms. Goal status: New   2. Patient will demonstrate independent use of home exercise program to facilitate ability to maintain/progress functional gains from skilled physical therapy services. Goal status: New   3. Patient will demonstrate FOTO outcome > or = 64 % to indicate reduced disability due  to condition. Goal status: New   4. Patient will demonstrate lumbar extension 100 % WFL s symptoms to facilitate upright standing, walking posture at PLOF s limitation. Goal status: New   5.  Patient will demonstrate bilateral lower extremity MMT 5/5 throughout to faciltiate usual transfers, stairs, squatting at [facility] for daily life.     Goal status: New   6.  Patient will demonstrate bilateral knee AROM 0-125 s symptoms for normal mobility at PLOF.    Goal status: New   7.  Patient will demonstrate bilateral SLS control > 15 seconds s deviation to facilitate stability in ambulation on even and uneven surfaces.  a.  Goal Status: New 8.  Patient will demonstrate/report reciprocal gait pattern on stairs with one handrail assist s symptoms for household navigation.    a. Goal status: new    PLAN: PT FREQUENCY: 1-2x/week  PT DURATION: 10 weeks  PLANNED INTERVENTIONS: Therapeutic exercises, Therapeutic activity, Neuro Muscular re-education, Balance training, Gait training, Patient/Family education, Joint mobilization, Stair training, DME instructions, Dry Needling, Electrical stimulation, Cryotherapy, Moist heat, Taping, Ultrasound, Ionotophoresis 4mg /ml Dexamethasone, and Manual therapy.  All included unless contraindicated   PLAN FOR NEXT SESSION: Continue c movement coordination core focus, possible Rt QL directed manual therapy if needed.  Progressive strengthening for quad in open and closed chain.   , PT MPT 11/05/21 8:44 AM   11/05/21  8:44 AM

## 2021-11-12 ENCOUNTER — Encounter: Payer: Self-pay | Admitting: Rehabilitative and Restorative Service Providers"

## 2021-11-12 ENCOUNTER — Ambulatory Visit (INDEPENDENT_AMBULATORY_CARE_PROVIDER_SITE_OTHER): Payer: No Typology Code available for payment source | Admitting: Rehabilitative and Restorative Service Providers"

## 2021-11-12 DIAGNOSIS — M25562 Pain in left knee: Secondary | ICD-10-CM | POA: Diagnosis not present

## 2021-11-12 DIAGNOSIS — R293 Abnormal posture: Secondary | ICD-10-CM

## 2021-11-12 DIAGNOSIS — G8929 Other chronic pain: Secondary | ICD-10-CM

## 2021-11-12 DIAGNOSIS — R262 Difficulty in walking, not elsewhere classified: Secondary | ICD-10-CM

## 2021-11-12 DIAGNOSIS — M6281 Muscle weakness (generalized): Secondary | ICD-10-CM

## 2021-11-12 DIAGNOSIS — M5459 Other low back pain: Secondary | ICD-10-CM

## 2021-11-12 DIAGNOSIS — M25561 Pain in right knee: Secondary | ICD-10-CM | POA: Diagnosis not present

## 2021-11-12 NOTE — Therapy (Signed)
OUTPATIENT PHYSICAL THERAPY TREATMENT   Patient Name: Kristen Fleming MRN: [id number] DOB:19-Apr-1985, 36 y.o., female Today's Date: 11/12/2021   PT End of Session - 11/12/21 1153     Visit Number 3    Number of Visits 20    Date for PT Re-Evaluation 01/08/22    Authorization Type [facility] $40 copay    PT Start Time 2706    PT Stop Time 1224    PT Time Calculation (min) 39 min    Activity Tolerance Patient tolerated treatment well    Behavior During Therapy WFL for tasks assessed/performed               Past Medical History:  Diagnosis Date   Bilateral knee pain    Hypertension    Obesity (BMI 30.0-34.9)    History reviewed. No pertinent surgical history. Patient Active Problem List   Diagnosis Date Noted   Obesity (BMI 30.0-34.9)    Hypertension    Bilateral knee pain    PAIN IN JOINT, MULTIPLE SITES 08/23/2009   MENSTRUAL PAIN 07/15/2008   ECZEMA 07/15/2008    PCP: Isaac Bliss, Rayford Halsted, MD  REFERRING PROVIDER: Meredith Pel, MD  REFERRING DIAG: M79.604,M79.605 (ICD-10-CM) - Bilateral leg pain  Rationale for Evaluation and Treatment Rehabilitation  THERAPY DIAG:  Chronic pain of left knee  Chronic pain of right knee  Other low back pain  Muscle weakness (generalized)  Abnormal posture  Difficulty in walking, not elsewhere classified  ONSET DATE: 2 years +  SUBJECTIVE:                                                                                                                                                                                           SUBJECTIVE STATEMENT: Pt stated no pain upon arrival today.  Pt indicated yesterday having pain in Rt knee to 10/10 but was not sure of activity.  She reported she was at church.    PERTINENT HISTORY:  Referral for bilateral leg pain, lumbar spine complaints.   Acute on chronic - history of several years.   PAIN:  NPRS scale: 0/10 upon arrival Pain location: back bilateral,  into thighs Pain description: tight pressure, numbness/tingling in thighs to knees (posterior/lateral) Aggravating factors: sitting/standing prolonged Relieving factors: heat, OTC medicine, rest  NPRS scale: 0/10 upon arrival, 10/10 (yesterday Rt knee) Pain location: knees bilateral, Lt worse at times Pain description: throbbing Aggravating factors: walking at times, stairs, squats  Relieving factors: rubbing knees, rest from pain activity, OTC   PRECAUTIONS: None  WEIGHT BEARING RESTRICTIONS No  FALLS:  Has patient fallen in last 6 months? No  LIVING ENVIRONMENT: Lives in: House/apartment Stairs:  multilevel house c stairs to bedroom with rail on Lt going up.    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  For knees:  reduce pain.    OBJECTIVE:   Imaging Review:   10/30/2021 eval review:  recent lumbar MRI imaging exhibits partially sacralized L5 vertebrae, minimal grade 1 retrolisthesis at L1-L2, L2-L3, L3-L4 and L4-L5. No high grade spinal canal stenosis noted.  PATIENT SURVEYS:  10/30/2021 Knee based FOTO intake: 38   predicted:  64  SCREENING FOR RED FLAGS: 10/30/2021 Bowel or bladder incontinence: No Cauda equina syndrome: No  COGNITION: 10/30/2021  Overall cognitive status: WFL normal    SENSATION: 10/30/2021 WFL today light touch  MUSCLE LENGTH: 10/30/2021 Supine passive slr Lt 90 deg without back pain, supine passive SLR Rt 88 deg without back pain  POSTURE:  10/30/2021  Mild increase in observed lumbar lordosis in standing, equal iliac crest, no observed lateral shift  PALPATION: 10/30/2021 Lt lumbar tenderness (QL, paraspinals).   LUMBAR ROM:   10/30/2021 eval:  no  centralization/peripheral noted,  no instances of numbness noted during any time in visit today Active  AROM  10/30/2021 AROM 11/12/2021  Flexion To toes, painful arc upon return.  To toes, no complaints  Extension 75% c ERP lumbar no leg symptoms  Repeated x 5 c increased pain after, 100 % WFL c ERP noted Lt lumbar   100 % without complaints  Right lateral flexion To head of fibula no complaints   Left lateral flexion To head of fibula no complaints   Right rotation    Left rotation     (Blank rows = not tested)  LOWER EXTREMITY ROM:     AROM Right 10/30/2021 Left 10/30/2021  Hip flexion    Hip extension    Hip abduction    Hip adduction    Hip internal rotation    Hip external rotation    Knee flexion 123 c end range pain In supine heel slide 124 c end range pain In supine heel slide  Knee extension 0 in seated LAQ 0 in seated LAQ  Ankle dorsiflexion    Ankle plantarflexion    Ankle inversion    Ankle eversion     (Blank rows = not tested)  LOWER EXTREMITY MMT:    MMT Right 10/30/2021 Left 10/30/2021  Hip flexion 5/5 5/5  Hip extension    Hip abduction    Hip adduction    Hip internal rotation    Hip external rotation    Knee flexion 5/5 5/5  Knee extension 5/5 4/5 c mild anterior knee pain  Ankle dorsiflexion 5/5 5/5  Ankle plantarflexion    Ankle inversion    Ankle eversion     (Blank rows = not tested)  LUMBAR SPECIAL TESTS:  10/30/2021 (-) slump bilateral, (-) crossed slr bilateral  FUNCTIONAL TESTS:  10/30/2021 18 inch chair sit to stand:  able without UE assist, fair control in lowering.  Lt SLS:  7 seconds  Rt SLS:  8 seconds - fair control noted during testing.   GAIT: 10/30/2021 Independent ambulation into clinic c no obvious deviation today.     TODAY'S TREATMENT  11/12/2021: Therex: Nustep lvl 5 10 mins UE/LE, RPM around 60-70 Leg press double leg 62 lbs x 15, single leg 37 lbs x 15 bilateral Seated SLR x 15 bilateral  Supine lumbar  trunk rotation stretch 15 sec x 3 bilateral Supine bridge 3sec x 15  Supine green band horizontal abduction x 20 bilateral Sit to stand to sit 18 inch chair x 10   Review of cat/camel and child pose (held today due to Rt knee pain reported yesterday - avoiding pressure on knee)    11/05/2021:  TherEx:  Supine STKC x 3 holding 20 sec Bridges: 2 x 10 holding 5 sec Supine SLR: x 10 bil LE's  Supine trunk rotation: x 2 holding 20 sec Child's pose: x 3 holding 20 seconds Cat/Camel: x 5 holding 10 sec Standing extension c elbows on wall x 3 holding 10 seconds Standing Rows: Level 3 band 2 x 15  Standing hip extension 2 x 10 Standing mini squats in parallel bars x 10 c UE support Leg Press 75# 2 x 15 bil LE's Modalities:  Moist heat: x 4 minutes to low back in seated position  10/30/2021 Therex:    HEP instruction/performance c cues for techniques, handout provided.  Trial set performed of each for comprehension and symptom assessment.  See below for exercise list    PATIENT EDUCATION:  10/30/2021 Education details: HEP, POC Person educated: Patient Education method: Explanation, Demonstration, Verbal cues, and Handouts Education comprehension: verbalized understanding, returned demonstration, and verbal cues required    HOME EXERCISE PROGRAM: Access Code: [email or web address] URL: [email or web address] Date: 10/30/2021 Prepared by: Scot Jun  Exercises - Supine Lower Trunk Rotation  - 2-3 x daily - 7 x weekly - 1 sets - 3-5 reps - 15 hold - Supine Pelvic Tilt with Straight Leg Raise  - 1-2 x daily - 7 x weekly - 1-2 sets - 10-15 reps - Seated Straight Leg Heel Taps  - 1-2 x daily - 7 x weekly - 1-2 sets - 10-15 reps - Supine Bridge  - 1-2 x daily - 7 x weekly - 1-2 sets - 10 reps - 3-5 hold - Supine Single Knee to Chest Stretch (Mirrored)  - 2 x daily - 7 x weekly - 1 sets - 5 reps - 15 hold - Sit to Stand  - 1 x daily - 7 x weekly - 3 sets - 10  reps  ASSESSMENT:  CLINICAL IMPRESSION: Pt to continue to benefit from progressive mobility and strengthening for back/core hips and legs to improve functional movement tolerance.  Low back symptoms and active mobility noted improved today compared to evaluation.     OBJECTIVE IMPAIRMENTS decreased activity tolerance, decreased balance, decreased coordination, decreased endurance, decreased mobility, difficulty walking, decreased ROM, decreased strength, hypomobility, increased fascial restrictions, impaired perceived functional ability, increased muscle spasms, impaired flexibility, improper body mechanics, postural dysfunction, and pain.   ACTIVITY LIMITATIONS carrying, lifting, bending, sitting, standing, squatting, stairs, transfers, and locomotion level  PARTICIPATION LIMITATIONS: cleaning, laundry, interpersonal relationship, shopping, community activity, and yard work  PERSONAL FACTORS  Multiple treatment areas, HTN, obesity, length of time since onset  are also affecting patient's functional outcome.   REHAB POTENTIAL: Good  CLINICAL DECISION MAKING: Stable/uncomplicated  EVALUATION COMPLEXITY: Low   GOALS: Goals reviewed with patient? Yes  Short term PT Goals (target date for Short term goals are 3 weeks 11/20/2021) Patient will demonstrate independent use of home exercise program to maintain progress from in clinic treatments. Goal status: MET 11/12/2021  Long term PT goals (target dates for all long term goals are 10 weeks  01/08/2022 )   1. Patient will demonstrate/report pain at worst less than or equal to 2/10 to facilitate minimal limitation in daily activity secondary to pain symptoms. Goal status: on going - assessed 11/12/2021   2. Patient will demonstrate independent use of home exercise program to facilitate ability to maintain/progress functional gains from skilled physical therapy services. Goal status: on going - assessed 11/12/2021   3. Patient will  demonstrate FOTO outcome > or = 64 % to indicate reduced disability due to condition. Goal status: on going - assessed 11/12/2021   4. Patient will demonstrate lumbar extension 100 % WFL s symptoms to facilitate upright standing, walking posture at PLOF s limitation. Goal status: on going - assessed 11/12/2021   5.  Patient will demonstrate bilateral lower extremity MMT 5/5 throughout to faciltiate usual transfers, stairs, squatting at [facility] for daily life.     Goal status: on going - assessed 11/12/2021   6.  Patient will demonstrate bilateral knee AROM 0-125 s symptoms for normal mobility at PLOF.   Goal status: on going - assessed 11/12/2021   7.  Patient will demonstrate bilateral SLS control > 15 seconds s deviation to facilitate stability in ambulation on even and uneven surfaces.  a.  Goal Status: on going - assessed 11/12/2021  8.  Patient will demonstrate/report reciprocal gait pattern on stairs with one handrail assist s symptoms for household navigation.    a. Goal status: on going - assessed 11/12/2021    PLAN: PT FREQUENCY: 1-2x/week  PT DURATION: 10 weeks  PLANNED INTERVENTIONS: Therapeutic exercises, Therapeutic activity, Neuro Muscular re-education, Balance training, Gait training, Patient/Family education, Joint mobilization, Stair training, DME instructions, Dry Needling, Electrical stimulation, Cryotherapy, Moist heat, Taping, Ultrasound, Ionotophoresis 6m/ml Dexamethasone, and Manual therapy.  All included unless contraindicated   PLAN FOR NEXT SESSION: Progressive strengthening LE, balance improvements.    MScot Jun PT, DPT, OCS, ATC 11/12/21  12:23 PM

## 2021-11-14 ENCOUNTER — Encounter: Payer: Self-pay | Admitting: Rehabilitative and Restorative Service Providers"

## 2021-11-14 ENCOUNTER — Ambulatory Visit (INDEPENDENT_AMBULATORY_CARE_PROVIDER_SITE_OTHER): Payer: No Typology Code available for payment source | Admitting: Rehabilitative and Restorative Service Providers"

## 2021-11-14 DIAGNOSIS — M25562 Pain in left knee: Secondary | ICD-10-CM | POA: Diagnosis not present

## 2021-11-14 DIAGNOSIS — M6281 Muscle weakness (generalized): Secondary | ICD-10-CM

## 2021-11-14 DIAGNOSIS — M5459 Other low back pain: Secondary | ICD-10-CM

## 2021-11-14 DIAGNOSIS — M25561 Pain in right knee: Secondary | ICD-10-CM

## 2021-11-14 DIAGNOSIS — G8929 Other chronic pain: Secondary | ICD-10-CM

## 2021-11-14 DIAGNOSIS — R293 Abnormal posture: Secondary | ICD-10-CM

## 2021-11-14 DIAGNOSIS — R262 Difficulty in walking, not elsewhere classified: Secondary | ICD-10-CM

## 2021-11-14 NOTE — Therapy (Signed)
OUTPATIENT PHYSICAL THERAPY TREATMENT   Patient Name: Kristen Fleming MRN: [id number] DOB:08/01/1985, 36 y.o., female Today's Date: 11/14/2021   PT End of Session - 11/14/21 0851     Visit Number 4    Number of Visits 20    Date for PT Re-Evaluation 01/08/22    Authorization Type [facility] $40 copay    PT Start Time 0841    PT Stop Time 0921    PT Time Calculation (min) 40 min    Activity Tolerance Patient tolerated treatment well    Behavior During Therapy WFL for tasks assessed/performed                Past Medical History:  Diagnosis Date   Bilateral knee pain    Hypertension    Obesity (BMI 30.0-34.9)    History reviewed. No pertinent surgical history. Patient Active Problem List   Diagnosis Date Noted   Obesity (BMI 30.0-34.9)    Hypertension    Bilateral knee pain    PAIN IN JOINT, MULTIPLE SITES 08/23/2009   MENSTRUAL PAIN 07/15/2008   ECZEMA 07/15/2008    PCP: Hernandez Acosta, Estela Y, MD  REFERRING PROVIDER: Dean, Gregory Scott, MD  REFERRING DIAG: M79.604,M79.605 (ICD-10-CM) - Bilateral leg pain  Rationale for Evaluation and Treatment Rehabilitation  THERAPY DIAG:  Chronic pain of left knee  Chronic pain of right knee  Other low back pain  Muscle weakness (generalized)  Abnormal posture  Difficulty in walking, not elsewhere classified  ONSET DATE: 2 years +  SUBJECTIVE:                                                                                                                                                                                           SUBJECTIVE STATEMENT: Pt indicated having improvement in Rt knee pain reported prior to last visit.  A little pain reported in back "tender".   PERTINENT HISTORY:  Referral for bilateral leg pain, lumbar spine complaints.   Acute on chronic - history of several years.   PAIN:  NPRS scale: 4/10 upon arrival Pain location: back bilateral, into thighs Pain description:  tight pressure, numbness/tingling in thighs to knees (posterior/lateral) Aggravating factors: sitting/standing prolonged Relieving factors: heat, OTC medicine, rest  NPRS scale: 0/10 upon arrival Pain location: knees bilateral, Lt worse at times Pain description: throbbing Aggravating factors: walking at times, stairs, squats  Relieving factors: rubbing knees, rest from pain activity, OTC   PRECAUTIONS: None  WEIGHT BEARING RESTRICTIONS No  FALLS:  Has patient fallen in last 6 months? No  LIVING ENVIRONMENT: Lives in: House/apartment Stairs:  multilevel house c stairs to bedroom with   rail on Lt going up.    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  For knees:  reduce pain.    OBJECTIVE:   Imaging Review:   10/30/2021 eval review:  recent lumbar MRI imaging exhibits partially sacralized L5 vertebrae, minimal grade 1 retrolisthesis at L1-L2, L2-L3, L3-L4 and L4-L5. No high grade spinal canal stenosis noted.  PATIENT SURVEYS:  10/30/2021 Knee based FOTO intake: 38   predicted:  64  SCREENING FOR RED FLAGS: 10/30/2021 Bowel or bladder incontinence: No Cauda equina syndrome: No  COGNITION: 10/30/2021  Overall cognitive status: WFL normal    SENSATION: 10/30/2021 WFL today light touch  MUSCLE LENGTH: 10/30/2021 Supine passive slr Lt 90 deg without back pain, supine passive SLR Rt 88 deg without back pain  POSTURE:  10/30/2021  Mild increase in observed lumbar lordosis in standing, equal iliac crest, no observed lateral shift  PALPATION: 10/30/2021 Lt lumbar tenderness (QL, paraspinals).   LUMBAR ROM:   10/30/2021 eval:  no centralization/peripheral noted,  no instances of numbness noted  during any time in visit today Active  AROM  10/30/2021 AROM 11/12/2021  Flexion To toes, painful arc upon return.  To toes, no complaints  Extension 75% c ERP lumbar no leg symptoms  Repeated x 5 c increased pain after, 100 % WFL c ERP noted Lt lumbar   100 % without complaints  Right lateral flexion To head of fibula no complaints   Left lateral flexion To head of fibula no complaints   Right rotation    Left rotation     (Blank rows = not tested)  LOWER EXTREMITY ROM:     AROM Right 10/30/2021 Left 10/30/2021  Hip flexion    Hip extension    Hip abduction    Hip adduction    Hip internal rotation    Hip external rotation    Knee flexion 123 c end range pain In supine heel slide 124 c end range pain In supine heel slide  Knee extension 0 in seated LAQ 0 in seated LAQ  Ankle dorsiflexion    Ankle plantarflexion    Ankle inversion    Ankle eversion     (Blank rows = not tested)  LOWER EXTREMITY MMT:    MMT Right 10/30/2021 Left 10/30/2021  Hip flexion 5/5 5/5  Hip extension    Hip abduction    Hip adduction    Hip internal rotation    Hip external rotation    Knee flexion 5/5 5/5  Knee extension 5/5 4/5 c mild anterior knee pain  Ankle dorsiflexion 5/5 5/5  Ankle plantarflexion    Ankle inversion    Ankle eversion     (Blank rows = not tested)  LUMBAR SPECIAL TESTS:  10/30/2021 (-) slump bilateral, (-) crossed slr bilateral  FUNCTIONAL TESTS:  10/30/2021 18 inch chair sit to stand:  able without UE assist, fair control in lowering.  Lt SLS:  7 seconds  Rt SLS:  8 seconds - fair control noted during testing.   GAIT: 10/30/2021 Independent ambulation into clinic c no obvious deviation today.     TODAY'S TREATMENT  11/14/2021: Therex: Nustep lvl 6 10 mins UE/LE, RPM around 60-70 Leg press double   leg 62 lbs x 15, single leg 62 lbs 2 x 15 bilateral Blue band tband rows, gh ext 2 x 15 each Supine lumbar trunk rotation stretch 15 sec x 3 bilateral Supine  bridge 3sec x 15  Supine  SLR x 15 bilateral    11/12/2021: Therex: Nustep lvl 5 10 mins UE/LE, RPM around 60-70 Leg press double leg 62 lbs x 15, single leg 37 lbs x 15 bilateral Seated SLR x 15 bilateral  Supine lumbar trunk rotation stretch 15 sec x 3 bilateral Supine bridge 3sec x 15  Supine green band horizontal abduction x 20 bilateral Sit to stand to sit 18 inch chair x 10   Review of cat/camel and child pose (held today due to Rt knee pain reported yesterday - avoiding pressure on knee)    11/05/2021:  TherEx:  Supine STKC x 3 holding 20 sec Bridges: 2 x 10 holding 5 sec Supine SLR: x 10 bil LE's  Supine trunk rotation: x 2 holding 20 sec Child's pose: x 3 holding 20 seconds Cat/Camel: x 5 holding 10 sec Standing extension c elbows on wall x 3 holding 10 seconds Standing Rows: Level 3 band 2 x 15  Standing hip extension 2 x 10 Standing mini squats in parallel bars x 10 c UE support Leg Press 75# 2 x 15 bil LE's Modalities:  Moist heat: x 4 minutes to low back in seated position  10/30/2021 Therex:    HEP instruction/performance c cues for techniques, handout provided.  Trial set performed of each for comprehension and symptom assessment.  See below for exercise list    PATIENT EDUCATION:  10/30/2021 Education details: HEP, POC Person educated: Patient Education method: Explanation, Demonstration, Verbal cues, and Handouts Education comprehension: verbalized understanding, returned demonstration, and verbal cues required    HOME EXERCISE PROGRAM: Access Code: [email or web address] URL: [email or web address] Date: 10/30/2021 Prepared by: Michael Wright  Exercises - Supine Lower Trunk Rotation  - 2-3 x daily - 7 x weekly - 1 sets - 3-5 reps - 15 hold - Supine Pelvic Tilt with Straight Leg Raise  - 1-2 x daily - 7 x weekly - 1-2 sets - 10-15 reps - Seated Straight Leg Heel Taps  - 1-2 x daily - 7 x weekly - 1-2 sets - 10-15 reps - Supine Bridge  - 1-2 x  daily - 7 x weekly - 1-2 sets - 10 reps - 3-5 hold - Supine Single Knee to Chest Stretch (Mirrored)  - 2 x daily - 7 x weekly - 1 sets - 5 reps - 15 hold - Sit to Stand  - 1 x daily - 7 x weekly - 3 sets - 10 reps  ASSESSMENT:  CLINICAL IMPRESSION: Pt demonstrated large jump in resistance on leg press today single leg without pain complaints noted, showing improvement in capacity which will help squats /stairs/walking.  Continued focus on mobility intervention for lumbar symptoms in short term for pain relief paired c progressive core strengthening for daily activity.    OBJECTIVE IMPAIRMENTS decreased activity tolerance, decreased balance, decreased coordination, decreased endurance, decreased mobility, difficulty walking, decreased ROM, decreased strength, hypomobility, increased fascial restrictions, impaired perceived functional ability, increased muscle spasms, impaired flexibility, improper body mechanics, postural dysfunction, and pain.   ACTIVITY LIMITATIONS carrying, lifting, bending, sitting, standing, squatting, stairs, transfers, and locomotion level  PARTICIPATION LIMITATIONS: cleaning, laundry, interpersonal relationship, shopping, community activity, and yard work  PERSONAL FACTORS  Multiple treatment areas, HTN, obesity, length of time since onset  are also   affecting patient's functional outcome.   REHAB POTENTIAL: Good  CLINICAL DECISION MAKING: Stable/uncomplicated  EVALUATION COMPLEXITY: Low   GOALS: Goals reviewed with patient? Yes  Short term PT Goals (target date for Short term goals are 3 weeks 11/20/2021) Patient will demonstrate independent use of home exercise program to maintain progress from in clinic treatments. Goal status: MET 11/12/2021   Long term PT goals (target dates for all long term goals are 10 weeks  01/08/2022 )   1. Patient will demonstrate/report pain at worst less than or equal to 2/10 to facilitate minimal limitation in daily activity  secondary to pain symptoms. Goal status: on going - assessed 11/12/2021   2. Patient will demonstrate independent use of home exercise program to facilitate ability to maintain/progress functional gains from skilled physical therapy services. Goal status: on going - assessed 11/12/2021   3. Patient will demonstrate FOTO outcome > or = 64 % to indicate reduced disability due to condition. Goal status: on going - assessed 11/12/2021   4. Patient will demonstrate lumbar extension 100 % WFL s symptoms to facilitate upright standing, walking posture at PLOF s limitation. Goal status: on going - assessed 11/12/2021   5.  Patient will demonstrate bilateral lower extremity MMT 5/5 throughout to faciltiate usual transfers, stairs, squatting at [facility] for daily life.     Goal status: on going - assessed 11/12/2021   6.  Patient will demonstrate bilateral knee AROM 0-125 s symptoms for normal mobility at PLOF.   Goal status: on going - assessed 11/12/2021   7.  Patient will demonstrate bilateral SLS control > 15 seconds s deviation to facilitate stability in ambulation on even and uneven surfaces.  a.  Goal Status: on going - assessed 11/12/2021  8.  Patient will demonstrate/report reciprocal gait pattern on stairs with one handrail assist s symptoms for household navigation.    a. Goal status: on going - assessed 11/12/2021    PLAN: PT FREQUENCY: 1-2x/week  PT DURATION: 10 weeks  PLANNED INTERVENTIONS: Therapeutic exercises, Therapeutic activity, Neuro Muscular re-education, Balance training, Gait training, Patient/Family education, Joint mobilization, Stair training, DME instructions, Dry Needling, Electrical stimulation, Cryotherapy, Moist heat, Taping, Ultrasound, Ionotophoresis 56m/ml Dexamethasone, and Manual therapy.  All included unless contraindicated   PLAN FOR NEXT SESSION: Continue progressive quad strengthening, mobility improvements for lumbar.    MScot Jun PT, DPT, OCS,  ATC 11/14/21  9:20 AM

## 2021-11-20 ENCOUNTER — Encounter: Payer: Self-pay | Admitting: Physical Therapy

## 2021-11-20 ENCOUNTER — Ambulatory Visit (INDEPENDENT_AMBULATORY_CARE_PROVIDER_SITE_OTHER): Payer: No Typology Code available for payment source | Admitting: Physical Therapy

## 2021-11-20 DIAGNOSIS — R293 Abnormal posture: Secondary | ICD-10-CM

## 2021-11-20 DIAGNOSIS — M25561 Pain in right knee: Secondary | ICD-10-CM

## 2021-11-20 DIAGNOSIS — M25562 Pain in left knee: Secondary | ICD-10-CM | POA: Diagnosis not present

## 2021-11-20 DIAGNOSIS — M6281 Muscle weakness (generalized): Secondary | ICD-10-CM

## 2021-11-20 DIAGNOSIS — M5459 Other low back pain: Secondary | ICD-10-CM | POA: Diagnosis not present

## 2021-11-20 DIAGNOSIS — R262 Difficulty in walking, not elsewhere classified: Secondary | ICD-10-CM

## 2021-11-20 DIAGNOSIS — G8929 Other chronic pain: Secondary | ICD-10-CM

## 2021-11-20 NOTE — Therapy (Signed)
OUTPATIENT PHYSICAL THERAPY TREATMENT   Patient Name: Kristen Fleming MRN: [id number] DOB:[birth year], 36 y.o., female Today's Date: 11/20/2021   PT End of Session - 11/20/21 0922     Visit Number 5    Number of Visits 20    Date for PT Re-Evaluation 01/08/22    Authorization Type [facility] $40 copay    PT Start Time 0848    PT Stop Time 0928    PT Time Calculation (min) 40 min    Activity Tolerance Patient tolerated treatment well    Behavior During Therapy WFL for tasks assessed/performed                 Past Medical History:  Diagnosis Date   Bilateral knee pain    Hypertension    Obesity (BMI 30.0-34.9)    History reviewed. No pertinent surgical history. Patient Active Problem List   Diagnosis Date Noted   Obesity (BMI 30.0-34.9)    Hypertension    Bilateral knee pain    PAIN IN JOINT, MULTIPLE SITES 08/23/2009   MENSTRUAL PAIN 07/15/2008   ECZEMA 07/15/2008    PCP: Isaac Bliss, Rayford Halsted, MD  REFERRING PROVIDER: Meredith Pel, MD  REFERRING DIAG: M79.604,M79.605 (ICD-10-CM) - Bilateral leg pain  Rationale for Evaluation and Treatment Rehabilitation  THERAPY DIAG:  Chronic pain of left knee  Chronic pain of right knee  Other low back pain  Muscle weakness (generalized)  Abnormal posture  Difficulty in walking, not elsewhere classified  ONSET DATE: 2 years +  SUBJECTIVE:                                                                                                                                                                                           SUBJECTIVE STATEMENT: Pt arriving today with 4/10 pain in her low back which is centralized.   PERTINENT HISTORY:  Referral for bilateral leg pain, lumbar spine complaints.   Acute on chronic - history of several years.   PAIN:  NPRS scale: 4/10 upon arrival Pain location: central low back Pain description: tight pressure, numbness/tingling in thighs to knees  (posterior/lateral) Aggravating factors: sitting/standing prolonged Relieving factors: heat, OTC medicine, rest  NPRS scale: 0/10 upon arrival Pain location: knees bilateral, Lt worse at times Pain description: throbbing Aggravating factors: walking at times, stairs, squats  Relieving factors: rubbing knees, rest from pain activity, OTC   PRECAUTIONS: None  WEIGHT BEARING RESTRICTIONS No  FALLS:  Has patient fallen in last 6 months? No  LIVING ENVIRONMENT: Lives in: House/apartment Stairs:  multilevel house c stairs to bedroom with rail on Lt going up.  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  For knees:  reduce pain.    OBJECTIVE:   Imaging Review:   10/30/2021 eval review:  recent lumbar MRI imaging exhibits partially sacralized L5 vertebrae, minimal grade 1 retrolisthesis at L1-L2, L2-L3, L3-L4 and L4-L5. No high grade spinal canal stenosis noted.  PATIENT SURVEYS:  10/30/2021 Knee based FOTO intake: 38   predicted:  64  SCREENING FOR RED FLAGS: 10/30/2021 Bowel or bladder incontinence: No Cauda equina syndrome: No  COGNITION: 10/30/2021  Overall cognitive status: WFL normal    SENSATION: 10/30/2021 WFL today light touch  MUSCLE LENGTH: 10/30/2021 Supine passive slr Lt 90 deg without back pain, supine passive SLR Rt 88 deg without back pain  POSTURE:  10/30/2021  Mild increase in observed lumbar lordosis in standing, equal iliac crest, no observed lateral shift  PALPATION: 10/30/2021 Lt lumbar tenderness (QL, paraspinals).   LUMBAR ROM:   10/30/2021 eval:  no centralization/peripheral noted,  no instances of numbness noted during any time in visit today Active  AROM   10/30/2021 AROM 11/12/2021 AROM 11/20/21  Flexion To toes, painful arc upon return.  To toes, no complaints   Extension 75% c ERP lumbar no leg symptoms  Repeated x 5 c increased pain after, 100 % WFL c ERP noted Lt lumbar   100 % without complaints   Right lateral flexion To head of fibula no complaints    Left lateral flexion To head of fibula no complaints    Right rotation     Left rotation      (Blank rows = not tested)  LOWER EXTREMITY ROM:     AROM Right 10/30/2021 Left 10/30/2021 11/20/21 supine  Hip flexion     Hip extension     Hip abduction     Hip adduction     Hip internal rotation     Hip external rotation     Knee flexion 123 c end range pain In supine heel slide 124 c end range pain In supine heel slide Left: 126 Rt: 128 Tightness noted   Knee extension 0 in seated LAQ 0 in seated LAQ   Ankle dorsiflexion     Ankle plantarflexion     Ankle inversion     Ankle eversion      (Blank rows = not tested)  LOWER EXTREMITY MMT:    MMT Right 10/30/2021 Left 10/30/2021  Hip flexion 5/5 5/5  Hip extension    Hip abduction    Hip adduction    Hip internal rotation    Hip external rotation    Knee flexion 5/5 5/5  Knee extension 5/5 4/5 c mild anterior knee pain  Ankle dorsiflexion 5/5 5/5  Ankle plantarflexion    Ankle inversion    Ankle eversion     (Blank rows = not tested)  LUMBAR SPECIAL TESTS:  10/30/2021 (-) slump bilateral, (-) crossed slr bilateral  FUNCTIONAL TESTS:  10/30/2021 18 inch chair sit to stand:  able without UE assist, fair control in lowering.  Lt SLS:  7 seconds  Rt SLS:  8 seconds - fair control noted during testing.  11/20/21:  SLS on Rt: 12 sec SLS on Left 13 seconds  GAIT: 10/30/2021 Independent ambulation into clinic c no obvious deviation today.     TODAY'S TREATMENT  11/20/2021 TherEx:  Recumbent bike: Level 3 x 7 minutes Bridges c clam shell: Level 3 band 2 x 10 holding 5 sec Supine trunk rotation: x 2 holding 20  sec Standing hip extension x 15 each LE c UE support Standing extension c elbows on wall x 3 holding 10 seconds Standing Rows: Level 3 band 2 x 15  TRX squats 2 x 10 Leg Press 100# 2 x 15 bil LE's Leg Press: 50# double leg jumping 2 x 10  11/14/2021: Therex: Nustep lvl 6 10 mins UE/LE, RPM around 60-70 Leg press double leg 62 lbs x 15, single leg 62 lbs 2 x 15 bilateral Blue band tband rows, gh ext 2 x 15 each Supine lumbar trunk rotation stretch 15 sec x 3 bilateral Supine bridge 3sec x 15  Supine  SLR x 15 bilateral    11/12/2021: Therex: Nustep lvl 5 10 mins UE/LE, RPM around 60-70 Leg press double leg 62 lbs x 15, single leg 37 lbs x 15 bilateral Seated SLR x 15 bilateral  Supine lumbar trunk rotation stretch 15 sec x 3 bilateral Supine bridge 3sec x 15  Supine green band horizontal abduction x 20 bilateral Sit to stand to sit 18 inch chair x 10   Review of cat/camel and child pose (held today due to Rt knee pain reported yesterday - avoiding pressure on knee)         PATIENT EDUCATION:  10/30/2021 Education details: HEP, POC Person educated: Patient Education method: Consulting civil engineer, Demonstration, Verbal cues, and Handouts Education comprehension: verbalized understanding, returned demonstration, and verbal cues required    HOME EXERCISE PROGRAM: Access Code: [email or web address] URL: [email or web address] Date: 10/30/2021 Prepared by: Scot Jun  Exercises - Supine Lower Trunk Rotation  - 2-3 x daily - 7 x weekly - 1 sets - 3-5 reps - 15 hold - Supine Pelvic Tilt with Straight Leg Raise  - 1-2 x daily - 7 x weekly - 1-2 sets - 10-15 reps - Seated Straight Leg Heel Taps  - 1-2 x daily - 7 x weekly - 1-2 sets - 10-15 reps - Supine Bridge  - 1-2 x daily - 7 x weekly - 1-2 sets - 10 reps - 3-5 hold - Supine Single Knee to Chest Stretch (Mirrored)  - 2 x daily - 7 x weekly - 1 sets - 5 reps - 15 hold - Sit to Stand  - 1 x daily - 7 x weekly - 3 sets - 10  reps  ASSESSMENT:  CLINICAL IMPRESSION: Pt arriving today with 4/10 pain in center of pt's back. Pt reporting improvements in her mobility at home. No knee pain noted this visit only tightness noted during ROM measurements. Pt has improved her SLS bilaterally to > 10 seconds.  Pt again able to tolerate beginning plymometric exercises for improved functional mobility. Continue skilled PT to maximize function.    OBJECTIVE IMPAIRMENTS decreased activity tolerance, decreased balance, decreased coordination, decreased endurance, decreased mobility, difficulty walking, decreased ROM, decreased strength, hypomobility, increased fascial restrictions, impaired perceived functional ability, increased muscle spasms, impaired flexibility, improper body mechanics, postural dysfunction, and pain.   ACTIVITY LIMITATIONS carrying, lifting, bending, sitting, standing, squatting, stairs, transfers, and locomotion level  PARTICIPATION LIMITATIONS: cleaning, laundry, interpersonal relationship, shopping, community activity, and yard work  PERSONAL FACTORS  Multiple treatment areas, HTN, obesity, length of time since onset  are also affecting patient's functional outcome.  REHAB POTENTIAL: Good  CLINICAL DECISION MAKING: Stable/uncomplicated  EVALUATION COMPLEXITY: Low   GOALS: Goals reviewed with patient? Yes  Short term PT Goals (target date for Short term goals are 3 weeks 11/20/2021) Patient will demonstrate independent use of home exercise program to maintain progress from in clinic treatments. Goal status: MET 11/12/2021   Long term PT goals (target dates for all long term goals are 10 weeks  01/08/2022 )   1. Patient will demonstrate/report pain at worst less than or equal to 2/10 to facilitate minimal limitation in daily activity secondary to pain symptoms. Goal status: on going - assessed 11/20/2021   2. Patient will demonstrate independent use of home exercise program to facilitate ability to  maintain/progress functional gains from skilled physical therapy services. Goal status: on going - assessed 11/20/2021   3. Patient will demonstrate FOTO outcome > or = 64 % to indicate reduced disability due to condition. Goal status: on going - assessed 11/12/2021   4. Patient will demonstrate lumbar extension 100 % WFL s symptoms to facilitate upright standing, walking posture at PLOF s limitation. Goal status: on going - assessed 11/20/2021   5.  Patient will demonstrate bilateral lower extremity MMT 5/5 throughout to faciltiate usual transfers, stairs, squatting at [facility] for daily life.     Goal status: on going - assessed 11/12/2021   6.  Patient will demonstrate bilateral knee AROM 0-125 s symptoms for normal mobility at PLOF.   Goal status: on going - assessed 11/20/2021   7.  Patient will demonstrate bilateral SLS control > 15 seconds s deviation to facilitate stability in ambulation on even and uneven surfaces.  a.  Goal Status: on going - assessed 11/20/2021  8.  Patient will demonstrate/report reciprocal gait pattern on stairs with one handrail assist s symptoms for household navigation.    a. Goal status: on going - assessed 11/12/2021    PLAN: PT FREQUENCY: 1-2x/week  PT DURATION: 10 weeks  PLANNED INTERVENTIONS: Therapeutic exercises, Therapeutic activity, Neuro Muscular re-education, Balance training, Gait training, Patient/Family education, Joint mobilization, Stair training, DME instructions, Dry Needling, Electrical stimulation, Cryotherapy, Moist heat, Taping, Ultrasound, Ionotophoresis 46m/ml Dexamethasone, and Manual therapy.  All included unless contraindicated   PLAN FOR NEXT SESSION: Continue progressive quad strengthening, mobility improvements for lumbar.   JKearney Hard PT, MPT 11/20/21 9:30 AM   11/20/21  9:23 AM

## 2021-11-26 ENCOUNTER — Encounter: Payer: Self-pay | Admitting: Rehabilitative and Restorative Service Providers"

## 2021-11-26 ENCOUNTER — Ambulatory Visit (INDEPENDENT_AMBULATORY_CARE_PROVIDER_SITE_OTHER): Payer: No Typology Code available for payment source | Admitting: Rehabilitative and Restorative Service Providers"

## 2021-11-26 DIAGNOSIS — G8929 Other chronic pain: Secondary | ICD-10-CM

## 2021-11-26 DIAGNOSIS — R293 Abnormal posture: Secondary | ICD-10-CM

## 2021-11-26 DIAGNOSIS — R262 Difficulty in walking, not elsewhere classified: Secondary | ICD-10-CM

## 2021-11-26 DIAGNOSIS — M25562 Pain in left knee: Secondary | ICD-10-CM

## 2021-11-26 DIAGNOSIS — M25561 Pain in right knee: Secondary | ICD-10-CM | POA: Diagnosis not present

## 2021-11-26 DIAGNOSIS — M5459 Other low back pain: Secondary | ICD-10-CM

## 2021-11-26 DIAGNOSIS — M6281 Muscle weakness (generalized): Secondary | ICD-10-CM | POA: Diagnosis not present

## 2021-11-26 NOTE — Therapy (Addendum)
OUTPATIENT PHYSICAL THERAPY TREATMENT  /DISCHARGE   Patient Name: Kristen Fleming MRN: [id number] DOB:05-Nov-1985, 36 y.o., female Today's Date: 11/26/2021   PT End of Session - 11/26/21 0813     Visit Number 6    Number of Visits 20    Date for PT Re-Evaluation 01/08/22    Authorization Type [facility] $40 copay    PT Start Time 0804    PT Stop Time 0843    PT Time Calculation (min) 39 min    Activity Tolerance Patient tolerated treatment well    Behavior During Therapy WFL for tasks assessed/performed                  Past Medical History:  Diagnosis Date   Bilateral knee pain    Hypertension    Obesity (BMI 30.0-34.9)    History reviewed. No pertinent surgical history. Patient Active Problem List   Diagnosis Date Noted   Obesity (BMI 30.0-34.9)    Hypertension    Bilateral knee pain    PAIN IN JOINT, MULTIPLE SITES 08/23/2009   MENSTRUAL PAIN 07/15/2008   ECZEMA 07/15/2008    PCP: Isaac Bliss, Rayford Halsted, MD  REFERRING PROVIDER: Meredith Pel, MD  REFERRING DIAG: M79.604,M79.605 (ICD-10-CM) - Bilateral leg pain  Rationale for Evaluation and Treatment Rehabilitation  THERAPY DIAG:  Chronic pain of left knee  Chronic pain of right knee  Other low back pain  Muscle weakness (generalized)  Abnormal posture  Difficulty in walking, not elsewhere classified  ONSET DATE: 2 years +  SUBJECTIVE:                                                                                                                                                                                           SUBJECTIVE STATEMENT: Pt indicated no specific things from the weekend that were.  Pt indicated mild back pain with sitting prolonged.   PERTINENT HISTORY:  Referral for bilateral leg pain, lumbar spine complaints.   Acute on chronic - history of several years.   PAIN:  NPRS scale: mild was reported  Pain location: central low back Pain description: tight  pressure, numbness/tingling in thighs to knees (posterior/lateral) Aggravating factors: sitting/standing prolonged Relieving factors: heat, OTC medicine, rest  NPRS scale: 0/10 upon arrival Pain location: knees bilateral, Lt worse at times Pain description: throbbing Aggravating factors: walking at times, stairs, squats  Relieving factors: rubbing knees, rest from pain activity, OTC   PRECAUTIONS: None  WEIGHT BEARING RESTRICTIONS No  FALLS:  Has patient fallen in last 6 months? No  LIVING ENVIRONMENT: Lives in: House/apartment Stairs:  multilevel house c stairs to  bedroom with rail on Lt going up.    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  For knees:  reduce pain.    OBJECTIVE:   Imaging Review:   10/30/2021 eval review:  recent lumbar MRI imaging exhibits partially sacralized L5 vertebrae, minimal grade 1 retrolisthesis at L1-L2, L2-L3, L3-L4 and L4-L5. No high grade spinal canal stenosis noted.  PATIENT SURVEYS:  11/26/2021 update:  77  10/30/2021 Knee based FOTO intake: 38   predicted:  64  SCREENING FOR RED FLAGS: 10/30/2021 Bowel or bladder incontinence: No Cauda equina syndrome: No  COGNITION: 10/30/2021  Overall cognitive status: WFL normal    SENSATION: 10/30/2021 WFL today light touch  MUSCLE LENGTH: 10/30/2021 Supine passive slr Lt 90 deg without back pain, supine passive SLR Rt 88 deg without back pain  POSTURE:  10/30/2021  Mild increase in observed lumbar lordosis in standing, equal iliac crest, no observed lateral shift  PALPATION: 10/30/2021 Lt lumbar tenderness (QL, paraspinals).   LUMBAR ROM:   10/30/2021 eval:  no centralization/peripheral noted,  no instances  of numbness noted during any time in visit today Active  AROM  10/30/2021 AROM 11/12/2021 AROM 11/26/21  Flexion To toes, painful arc upon return.  To toes, no complaints   Extension 75% c ERP lumbar no leg symptoms  Repeated x 5 c increased pain after, 100 % WFL c ERP noted Lt lumbar   100 % without complaints 100 % WFL  Right lateral flexion To head of fibula no complaints    Left lateral flexion To head of fibula no complaints    Right rotation     Left rotation      (Blank rows = not tested)  LOWER EXTREMITY ROM:     AROM Right 10/30/2021 Left 10/30/2021 11/20/21 supine  Hip flexion     Hip extension     Hip abduction     Hip adduction     Hip internal rotation     Hip external rotation     Knee flexion 123 c end range pain In supine heel slide 124 c end range pain In supine heel slide Left: 126 Rt: 128 Tightness noted   Knee extension 0 in seated LAQ 0 in seated LAQ   Ankle dorsiflexion     Ankle plantarflexion     Ankle inversion     Ankle eversion      (Blank rows = not tested)  LOWER EXTREMITY MMT:    MMT Right 10/30/2021 Left 10/30/2021 Left 11/26/2021  Hip flexion 5/5 5/5   Hip extension     Hip abduction     Hip adduction     Hip internal rotation     Hip external rotation     Knee flexion 5/5 5/5   Knee extension 5/5 4/5 c mild anterior knee pain 5/5  Ankle dorsiflexion 5/5 5/5   Ankle plantarflexion     Ankle inversion     Ankle eversion      (Blank rows = not tested)  LUMBAR SPECIAL TESTS:  10/30/2021 (-) slump bilateral, (-) crossed slr bilateral  FUNCTIONAL TESTS:  10/30/2021 18 inch chair sit to stand:  able without UE assist, fair control in lowering.  Lt SLS:  7 seconds  Rt SLS:  8 seconds - fair control noted during testing.  11/20/21:  SLS on Rt: 12 sec SLS on Left 13 seconds  GAIT: 10/30/2021 Independent ambulation into clinic c no obvious deviation today.     TODAY'S TREATMENT  11/26/2021 Therex: Recumbent bike lvl 3 8 mins,  seat 7 Leg press:   double leg 100 lbs x 20, single leg 50 lbs 2 x 15 14 steps reciprocal gait pattern in stairway up/down with single handrail assist  Review of existing HEP for trial HEP period.  Supine lumbar trunk rotation stretch 15 sec x 1 bilateral Supine Slr x 10 bilateral (cues for sitting version) Supine bridge 5 sec hold x 10 Supine bridge c green band clam shell x 10  Standing tband green rows x 20, gh x 20   11/20/2021 TherEx:  Recumbent bike: Level 3 x 7 minutes Bridges c clam shell: Level 3 band 2 x 10 holding 5 sec Supine trunk rotation: x 2 holding 20 sec Standing hip extension x 15 each LE c UE support Standing extension c elbows on wall x 3 holding 10 seconds Standing Rows: Level 3 band 2 x 15  TRX squats 2 x 10 Leg Press 100# 2 x 15 bil LE's Leg Press: 50# double leg jumping 2 x 10  11/14/2021: Therex: Nustep lvl 6 10 mins UE/LE, RPM around 60-70 Leg press double leg 62 lbs x 15, single leg 62 lbs 2 x 15 bilateral Blue band tband rows, gh ext 2 x 15 each Supine lumbar trunk rotation stretch 15 sec x 3 bilateral Supine bridge 3sec x 15  Supine  SLR x 15 bilateral    PATIENT EDUCATION:  10/30/2021 Education details: HEP, update Person educated: Patient Education method: Consulting civil engineer, Demonstration, Verbal cues, and Handouts Education comprehension: verbalized understanding, returned demonstration, and verbal cues required    HOME EXERCISE PROGRAM: Access Code: [email or web address] URL: [email or web address] Date: 11/26/2021 Prepared by: Scot Jun  Exercises - Supine Lower Trunk Rotation  - 2-3 x daily - 7 x weekly - 1 sets - 3-5 reps - 15 hold - Supine Pelvic Tilt with Straight Leg Raise  - 1-2 x daily - 7 x weekly - 1-2 sets - 10-15 reps - Seated Straight Leg Heel Taps  - 1-2 x daily - 7 x weekly - 1-2 sets - 10-15 reps - Supine Bridge  - 1-2 x daily - 7 x weekly - 1-2 sets - 10 reps - 3-5 hold - Supine Single Knee to Chest Stretch (Mirrored)   - 2 x daily - 7 x weekly - 1 sets - 5 reps - 15 hold - Sit to Stand  - 1 x daily - 7 x weekly - 3 sets - 10 reps - Standing Shoulder Row with Anchored Resistance  - 1-2 x daily - 7 x weekly - 1-2 sets - 10-15 reps - Shoulder Extension with Resistance  - 1-2 x daily - 7 x weekly - 1-2 sets - 10-15 reps  ASSESSMENT:  CLINICAL IMPRESSION: Reassessment of FOTO related to knee progress showed good improvement.  Continued reduction of overall symptoms indicated c daily life.  Positive symptom reduction with changes of position and HEP when symptoms are present (rated mild).    OBJECTIVE IMPAIRMENTS decreased activity tolerance, decreased balance, decreased coordination, decreased endurance, decreased mobility, difficulty walking, decreased ROM, decreased strength, hypomobility, increased fascial restrictions, impaired perceived functional ability, increased muscle spasms, impaired flexibility, improper body mechanics, postural dysfunction, and pain.  ACTIVITY LIMITATIONS carrying, lifting, bending, sitting, standing, squatting, stairs, transfers, and locomotion level  PARTICIPATION LIMITATIONS: cleaning, laundry, interpersonal relationship, shopping, community activity, and yard work  PERSONAL FACTORS  Multiple treatment areas, HTN, obesity, length of time since onset  are also affecting patient's functional outcome.   REHAB POTENTIAL: Good  CLINICAL DECISION MAKING: Stable/uncomplicated  EVALUATION COMPLEXITY: Low   GOALS: Goals reviewed with patient? Yes  Short term PT Goals (target date for Short term goals are 3 weeks 11/20/2021) Patient will demonstrate independent use of home exercise program to maintain progress from in clinic treatments. Goal status: MET 11/12/2021   Long term PT goals (target dates for all long term goals are 10 weeks  01/08/2022 )   1. Patient will demonstrate/report pain at worst less than or equal to 2/10 to facilitate minimal limitation in daily activity  secondary to pain symptoms. Goal status: on going - assessed 11/20/2021   2. Patient will demonstrate independent use of home exercise program to facilitate ability to maintain/progress functional gains from skilled physical therapy services. Goal status: on going - assessed 11/20/2021   3. Patient will demonstrate FOTO outcome > or = 64 % to indicate reduced disability due to condition. Goal status: MET 11/26/2021   4. Patient will demonstrate lumbar extension 100 % WFL s symptoms to facilitate upright standing, walking posture at PLOF s limitation. Goal status: MET 11/26/2021   5.  Patient will demonstrate bilateral lower extremity MMT 5/5 throughout to faciltiate usual transfers, stairs, squatting at [facility] for daily life.     Goal status: Met 11/26/2021   6.  Patient will demonstrate bilateral knee AROM 0-125 s symptoms for normal mobility at PLOF.   Goal status: on going - assessed 11/20/2021   7.  Patient will demonstrate bilateral SLS control > 15 seconds s deviation to facilitate stability in ambulation on even and uneven surfaces.  a.  Goal Status: on going - assessed 11/20/2021  8.  Patient will demonstrate/report reciprocal gait pattern on stairs with one handrail assist s symptoms for household navigation.    a. Goal status:Met 11/26/2021    PLAN: PT FREQUENCY: 1-2x/week  PT DURATION: 10 weeks  PLANNED INTERVENTIONS: Therapeutic exercises, Therapeutic activity, Neuro Muscular re-education, Balance training, Gait training, Patient/Family education, Joint mobilization, Stair training, DME instructions, Dry Needling, Electrical stimulation, Cryotherapy, Moist heat, Taping, Ultrasound, Ionotophoresis 66m/ml Dexamethasone, and Manual therapy.  All included unless contraindicated   PLAN FOR NEXT SESSION: Check on HEP use and effectiveness.  Possible discharge pending symptom response.   MScot Jun PT, DPT, OCS, ATC 11/26/21  8:45 AM  PHYSICAL THERAPY DISCHARGE SUMMARY  Visits  from Start of Care: 6  Current functional level related to goals / functional outcomes: See note   Remaining deficits: See note   Education / Equipment: HEP  Patient goals were partially met. Patient is being discharged due to not returning since the last visit.  MScot Jun PT, DPT, OCS, ATC 12/28/21  11:39 AM

## 2021-11-28 ENCOUNTER — Ambulatory Visit (INDEPENDENT_AMBULATORY_CARE_PROVIDER_SITE_OTHER): Payer: No Typology Code available for payment source

## 2021-11-28 ENCOUNTER — Encounter: Payer: No Typology Code available for payment source | Admitting: Rehabilitative and Restorative Service Providers"

## 2021-11-28 DIAGNOSIS — Z3042 Encounter for surveillance of injectable contraceptive: Secondary | ICD-10-CM

## 2021-11-28 MED ORDER — MEDROXYPROGESTERONE ACETATE 150 MG/ML IM SUSP
150.0000 mg | Freq: Once | INTRAMUSCULAR | Status: AC
  Administered 2021-11-28: 150 mg via INTRAMUSCULAR

## 2021-12-03 ENCOUNTER — Ambulatory Visit: Payer: No Typology Code available for payment source | Admitting: Physical Medicine and Rehabilitation

## 2021-12-04 ENCOUNTER — Ambulatory Visit: Payer: No Typology Code available for payment source | Admitting: Physical Medicine and Rehabilitation

## 2021-12-17 ENCOUNTER — Encounter: Payer: No Typology Code available for payment source | Admitting: Rehabilitative and Restorative Service Providers"

## 2022-02-13 ENCOUNTER — Ambulatory Visit (INDEPENDENT_AMBULATORY_CARE_PROVIDER_SITE_OTHER): Payer: No Typology Code available for payment source | Admitting: *Deleted

## 2022-02-13 DIAGNOSIS — Z3042 Encounter for surveillance of injectable contraceptive: Secondary | ICD-10-CM | POA: Diagnosis not present

## 2022-02-13 MED ORDER — MEDROXYPROGESTERONE ACETATE 150 MG/ML IM SUSP
150.0000 mg | Freq: Once | INTRAMUSCULAR | Status: AC
  Administered 2022-02-13: 150 mg via INTRAMUSCULAR

## 2022-03-12 ENCOUNTER — Encounter: Payer: Self-pay | Admitting: Internal Medicine

## 2022-05-01 ENCOUNTER — Ambulatory Visit (INDEPENDENT_AMBULATORY_CARE_PROVIDER_SITE_OTHER): Payer: 59

## 2022-05-01 DIAGNOSIS — Z3042 Encounter for surveillance of injectable contraceptive: Secondary | ICD-10-CM

## 2022-05-01 MED ORDER — MEDROXYPROGESTERONE ACETATE 150 MG/ML IM SUSP
150.0000 mg | Freq: Once | INTRAMUSCULAR | Status: AC
  Administered 2022-05-01: 150 mg via INTRAMUSCULAR

## 2022-05-01 NOTE — Progress Notes (Signed)
Patient ID: Kristen Fleming, female   DOB: 30-Aug-1985, 37 y.o.   MRN: [id number] Depo Provera 156m given IM LUOQ.  Patient tolerated injection well.  Next injection is due 07/18/22-08/01/22.  Patient will schedule AEX today at checkout.

## 2022-05-09 ENCOUNTER — Encounter: Payer: Self-pay | Admitting: Internal Medicine

## 2022-05-09 ENCOUNTER — Ambulatory Visit (INDEPENDENT_AMBULATORY_CARE_PROVIDER_SITE_OTHER): Payer: 59 | Admitting: Internal Medicine

## 2022-05-09 VITALS — BP 120/88 | HR 69 | Temp 98.1°F | Ht 65.0 in | Wt 225.0 lb

## 2022-05-09 DIAGNOSIS — E669 Obesity, unspecified: Secondary | ICD-10-CM | POA: Diagnosis not present

## 2022-05-09 DIAGNOSIS — Z1159 Encounter for screening for other viral diseases: Secondary | ICD-10-CM

## 2022-05-09 DIAGNOSIS — K219 Gastro-esophageal reflux disease without esophagitis: Secondary | ICD-10-CM | POA: Diagnosis not present

## 2022-05-09 DIAGNOSIS — I10 Essential (primary) hypertension: Secondary | ICD-10-CM | POA: Diagnosis not present

## 2022-05-09 DIAGNOSIS — Z0000 Encounter for general adult medical examination without abnormal findings: Secondary | ICD-10-CM

## 2022-05-09 LAB — COMPREHENSIVE METABOLIC PANEL
ALT: 28 U/L (ref 0–35)
AST: 23 U/L (ref 0–37)
Albumin: 4.3 g/dL (ref 3.5–5.2)
Alkaline Phosphatase: 68 U/L (ref 39–117)
BUN: 9 mg/dL (ref 6–23)
CO2: 26 mEq/L (ref 19–32)
Calcium: 9.8 mg/dL (ref 8.4–10.5)
Chloride: 105 mEq/L (ref 96–112)
Creatinine, Ser: 0.77 mg/dL (ref 0.40–1.20)
GFR: 98.87 mL/min (ref >60.00)
Glucose, Bld: 87 mg/dL (ref 70–99)
Potassium: 4 mEq/L (ref 3.5–5.1)
Sodium: 138 mEq/L (ref 135–145)
Total Bilirubin: 0.2 mg/dL (ref 0.2–1.2)
Total Protein: 7.8 g/dL (ref 6.0–8.3)

## 2022-05-09 LAB — CBC WITH DIFFERENTIAL/PLATELET
Basophils Absolute: 0 10*3/uL (ref 0.0–0.1)
Basophils Relative: 0.5 % (ref 0.0–3.0)
Eosinophils Absolute: 0.4 10*3/uL (ref 0.0–0.7)
Eosinophils Relative: 8 % — ABNORMAL HIGH (ref 0.0–5.0)
HCT: 40.6 % (ref 36.0–46.0)
Hemoglobin: 13.3 g/dL (ref 12.0–15.0)
Lymphocytes Relative: 39.3 % (ref 12.0–46.0)
Lymphs Abs: 2.1 10*3/uL (ref 0.7–4.0)
MCHC: 32.8 g/dL (ref 30.0–36.0)
MCV: 85 fl (ref 78.0–100.0)
Monocytes Absolute: 0.4 10*3/uL (ref 0.1–1.0)
Monocytes Relative: 7.6 % (ref 3.0–12.0)
Neutro Abs: 2.4 10*3/uL (ref 1.4–7.7)
Neutrophils Relative %: 44.6 % (ref 43.0–77.0)
Platelets: 266 10*3/uL (ref 150.0–400.0)
RBC: 4.77 Mil/uL (ref 3.87–5.11)
RDW: 14.5 % (ref 11.5–15.5)
WBC: 5.3 10*3/uL (ref 4.0–10.5)

## 2022-05-09 LAB — VITAMIN B12: Vitamin B-12: 410 pg/mL (ref 211–911)

## 2022-05-09 LAB — LIPID PANEL
Cholesterol: 173 mg/dL (ref 0–200)
HDL: 53.9 mg/dL (ref >39.00)
LDL Cholesterol: 108 mg/dL — ABNORMAL HIGH (ref 0–99)
NonHDL: 119.29
Total CHOL/HDL Ratio: 3
Triglycerides: 58 mg/dL (ref 0.0–149.0)
VLDL: 11.6 mg/dL (ref 0.0–40.0)

## 2022-05-09 LAB — VITAMIN D 25 HYDROXY (VIT D DEFICIENCY, FRACTURES): VITD: 16.49 ng/mL — ABNORMAL LOW (ref 30.00–100.00)

## 2022-05-09 LAB — TSH: TSH: 0.99 u[IU]/mL (ref 0.35–5.50)

## 2022-05-09 LAB — HEMOGLOBIN A1C: Hgb A1c MFr Bld: 6 % (ref 4.6–6.5)

## 2022-05-09 MED ORDER — PANTOPRAZOLE SODIUM 40 MG PO TBEC: 40.0000 mg | DELAYED_RELEASE_TABLET | Freq: Every day | ORAL | 1 refills | Status: DC

## 2022-05-09 NOTE — Progress Notes (Signed)
Established Patient Office Visit     CC/Reason for Visit: Annual preventive exam, discuss acute concerns  HPI: Kristen Fleming is a 37 y.o. female who is coming in today for the above mentioned reasons. Past Medical History is significant for: Hypertension being observed off medication, obesity.  She is feeling well and has no acute complaints.  Last year we had discussed using Prilosec OTC for her occasional GERD symptoms.  It appears to be worsening.  She is having to use Prilosec pretty much every day.  She now has bloating, belching.  She has routine eye and dental care, she exercises routinely.  She is due for COVID vaccination.  She has an upcoming appointment with GYN in March.   Past Medical/Surgical History: Past Medical History:  Diagnosis Date   Bilateral knee pain    Hypertension    Obesity (BMI 30.0-34.9)     No past surgical history on file.  Social History:  reports that she has never smoked. She has never used smokeless tobacco. She reports current alcohol use. She reports that she does not use drugs.  Allergies: No Known Allergies  Family History:  Family History  Problem Relation Age of Onset   Hypertension Maternal Grandmother    Diabetes Maternal Grandmother    Breast cancer Paternal Grandmother 50     Current Outpatient Medications:    methocarbamol (ROBAXIN) 500 MG tablet, Take 1 tablet (500 mg total) by mouth 3 (three) times daily., Disp: 90 tablet, Rfl: 0   pantoprazole (PROTONIX) 40 MG tablet, Take 1 tablet (40 mg total) by mouth daily., Disp: 90 tablet, Rfl: 1  Review of Systems:  Negative unless indicated in HPI.   Physical Exam: Vitals:   05/09/22 0848  BP: 120/88  Pulse: 69  Temp: 98.1 F (36.7 C)  TempSrc: Oral  SpO2: 99%  Weight: 225 lb (102.1 kg)  Height: 5' 5"$  (1.651 m)    Body mass index is 37.44 kg/m.   Physical Exam Vitals reviewed.  Constitutional:      General: She is not in acute distress.     Appearance: Normal appearance. She is not ill-appearing, toxic-appearing or diaphoretic.  HENT:     Head: Normocephalic.     Right Ear: Tympanic membrane, ear canal and external ear normal. There is no impacted cerumen.     Left Ear: Tympanic membrane, ear canal and external ear normal. There is no impacted cerumen.     Nose: Nose normal.     Mouth/Throat:     Mouth: Mucous membranes are moist.     Pharynx: Oropharynx is clear. No oropharyngeal exudate or posterior oropharyngeal erythema.  Eyes:     General: No scleral icterus.       Right eye: No discharge.        Left eye: No discharge.     Conjunctiva/sclera: Conjunctivae normal.     Pupils: Pupils are equal, round, and reactive to light.  Neck:     Vascular: No carotid bruit.  Cardiovascular:     Rate and Rhythm: Normal rate and regular rhythm.     Pulses: Normal pulses.     Heart sounds: Normal heart sounds.  Pulmonary:     Effort: Pulmonary effort is normal. No respiratory distress.     Breath sounds: Normal breath sounds.  Abdominal:     General: Abdomen is flat. Bowel sounds are normal.     Palpations: Abdomen is soft.  Musculoskeletal:  General: Normal range of motion.     Cervical back: Normal range of motion.  Skin:    General: Skin is warm and dry.  Neurological:     General: No focal deficit present.     Mental Status: She is alert and oriented to person, place, and time. Mental status is at baseline.  Psychiatric:        Mood and Affect: Mood normal.        Behavior: Behavior normal.        Thought Content: Thought content normal.        Judgment: Judgment normal.      Impression and Plan:  Encounter for preventive health examination  Primary hypertension - Plan: CBC with Differential/Platelet, Comprehensive metabolic panel  Gastroesophageal reflux disease, unspecified whether esophagitis present - Plan: pantoprazole (PROTONIX) 40 MG tablet  Obesity (BMI 35.0-39.9 without comorbidity) - Plan:  Lipid panel, TSH, Vitamin B12, VITAMIN D 25 Hydroxy (Vit-D Deficiency, Fractures), Hemoglobin A1c  Encounter for hepatitis C screening test for low risk patient - Plan: Hepatitis C antibody    -Recommend routine eye and dental care. -Healthy lifestyle discussed in detail. -Labs to be updated today. -Prostate cancer screening: Not applicable Health Maintenance  Topic Date Due   Hepatitis C Screening: USPSTF Recommendation to screen - Ages 28-79 yo.  Never done   COVID-19 Vaccine (4 - 2023-24 season) 05/25/2022*   Pap Smear  09/29/2022   DTaP/Tdap/Td vaccine (3 - Td or Tdap) 03/18/2029   Flu Shot  Completed   HIV Screening  Completed   HPV Vaccine  Aged Out  *Topic was postponed. The date shown is not the original due date.   -Advised to update COVID vaccination at pharmacy. -Start pantoprazole for treatment of GERD.  Reassess in 3 months.   Lelon Frohlich, MD [facility]

## 2022-05-10 LAB — HEPATITIS C ANTIBODY: Hepatitis C Ab: NONREACTIVE

## 2022-05-15 ENCOUNTER — Encounter: Payer: Self-pay | Admitting: Internal Medicine

## 2022-05-15 ENCOUNTER — Other Ambulatory Visit: Payer: Self-pay | Admitting: Internal Medicine

## 2022-05-15 DIAGNOSIS — E559 Vitamin D deficiency, unspecified: Secondary | ICD-10-CM | POA: Insufficient documentation

## 2022-05-15 DIAGNOSIS — R7302 Impaired glucose tolerance (oral): Secondary | ICD-10-CM | POA: Insufficient documentation

## 2022-05-15 MED ORDER — VITAMIN D (ERGOCALCIFEROL) 1.25 MG (50000 UNIT) PO CAPS: 50000.0000 [IU] | ORAL_CAPSULE | ORAL | 0 refills | Status: AC

## 2022-05-31 ENCOUNTER — Encounter: Payer: Self-pay | Admitting: Nurse Practitioner

## 2022-06-05 ENCOUNTER — Ambulatory Visit (INDEPENDENT_AMBULATORY_CARE_PROVIDER_SITE_OTHER): Payer: 59 | Admitting: Nurse Practitioner

## 2022-06-05 ENCOUNTER — Other Ambulatory Visit (HOSPITAL_COMMUNITY)
Admission: RE | Admit: 2022-06-05 | Discharge: 2022-06-05 | Disposition: A | Discharge status: Home / Self Care | Payer: 59 | Source: Ambulatory Visit | Attending: Nurse Practitioner | Admitting: Nurse Practitioner

## 2022-06-05 ENCOUNTER — Encounter: Payer: Self-pay | Admitting: Nurse Practitioner

## 2022-06-05 VITALS — BP 128/88 | HR 64 | Ht 64.5 in | Wt 219.0 lb

## 2022-06-05 DIAGNOSIS — Z124 Encounter for screening for malignant neoplasm of cervix: Secondary | ICD-10-CM | POA: Insufficient documentation

## 2022-06-05 DIAGNOSIS — Z3042 Encounter for surveillance of injectable contraceptive: Secondary | ICD-10-CM

## 2022-06-05 DIAGNOSIS — Z01419 Encounter for gynecological examination (general) (routine) without abnormal findings: Secondary | ICD-10-CM

## 2022-06-05 NOTE — Progress Notes (Signed)
   Kristen Fleming [birth year] [id number]   History:  37 y.o. G0 presents for annual exam. Restarted Depo January 2023 after having prolonged bleeding with Nexplanon. Occasionally light spotting. Normal pap history. HTN, GERD managed by PCP.   Gynecologic History Patient's last menstrual period was 04/19/2022 (approximate).   Contraception/Family planning: Depo-Provera injections Sexually active: No  Health Maintenance Last Pap: 09/29/2019. Results were: Normal neg  HPV, 5-year repeat Last mammogram: Not indicated Last colonoscopy: Not indicated Last Dexa: Not indicated  Past medical history, past surgical history, family history and social history were all reviewed and documented in the EPIC chart. 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   ROS:  A ROS was performed and pertinent positives and negatives are included.  Exam:  Vitals:   06/05/22 1012  BP: 128/88  Pulse: 64  SpO2: 99%  Weight: 219 lb (99.3 kg)  Height: 5' 4.5" (1.638 m)    Body mass index is 37.01 kg/m.  General appearance:  Normal Thyroid:  Symmetrical, normal in size, without palpable masses or nodularity. Respiratory  Auscultation:  Clear without wheezing or rhonchi Cardiovascular  Auscultation:  Regular rate, without rubs, murmurs or gallops  Edema/varicosities:  Not grossly evident Abdominal  Soft,nontender, without masses, guarding or rebound.  Liver/spleen:  No organomegaly noted  Hernia:  None appreciated  Skin  Inspection:  Grossly normal Breasts: Examined lying and sitting.   Right: Without masses, retractions, nipple discharge or axillary adenopathy.   Left: Without masses, retractions, nipple discharge or axillary adenopathy. Genitourinary   Inguinal/mons:  Normal without inguinal adenopathy  External genitalia:  Normal appearing vulva with no masses, tenderness, or lesions  BUS/Urethra/Skene's glands:  Normal  Vagina:  Normal appearing with normal color and discharge, no lesions  Cervix:   Normal appearing without discharge or lesions  Uterus:  Normal in size, shape and contour.  Midline and mobile, nontender  Adnexa/parametria:     Rt: Normal in size, without masses or tenderness.   Lt: Normal in size, without masses or tenderness.  Anus and perineum: Normal  Patient informed chaperone available to be present for breast and pelvic exam. Patient has requested no chaperone to be present. Patient has been advised what will be completed during breast and pelvic exam.   Assessment/Plan:  37 y.o. G0 for annual exam.   Well female exam with routine gynecological exam - Education provided on SBEs, importance of preventative screenings, current guidelines, high calcium diet, regular exercise, and multivitamin daily.  Labs with PCP.  Encounter for surveillance of injectable contraceptive - Restarted Depo January 2023 after having prolonged bleeding with Nexplanon. Occasionally light spotting.  Screening for cervical cancer - Plan: Cytology - PAP( [place]). Normal pap history.   Return in 1 year for annual.     Tamela Gammon DNP, 10:26 AM 06/05/2022

## 2022-06-10 LAB — CYTOLOGY - PAP
Adequacy: ABSENT
Comment: NEGATIVE
Diagnosis: NEGATIVE
High risk HPV: NEGATIVE

## 2022-06-13 ENCOUNTER — Other Ambulatory Visit: Payer: Self-pay | Admitting: *Deleted

## 2022-06-13 ENCOUNTER — Other Ambulatory Visit (HOSPITAL_COMMUNITY): Payer: Self-pay

## 2022-06-13 DIAGNOSIS — K219 Gastro-esophageal reflux disease without esophagitis: Secondary | ICD-10-CM

## 2022-06-13 MED ORDER — PANTOPRAZOLE SODIUM 40 MG PO TBEC
40.0000 mg | DELAYED_RELEASE_TABLET | Freq: Every day | ORAL | 1 refills | Status: DC
  Filled 2022-06-13: qty 90, 90d supply, fill #0
  Filled 2022-10-03: qty 90, 90d supply, fill #1

## 2022-06-17 ENCOUNTER — Ambulatory Visit (INDEPENDENT_AMBULATORY_CARE_PROVIDER_SITE_OTHER): Payer: 59 | Admitting: Internal Medicine

## 2022-06-17 ENCOUNTER — Other Ambulatory Visit (HOSPITAL_COMMUNITY): Payer: Self-pay

## 2022-06-17 VITALS — BP 130/82 | HR 84 | Temp 98.9°F | Ht 64.0 in | Wt 218.0 lb

## 2022-06-17 DIAGNOSIS — I10 Essential (primary) hypertension: Secondary | ICD-10-CM

## 2022-06-17 DIAGNOSIS — U071 COVID-19: Secondary | ICD-10-CM

## 2022-06-17 DIAGNOSIS — E559 Vitamin D deficiency, unspecified: Secondary | ICD-10-CM

## 2022-06-17 DIAGNOSIS — R062 Wheezing: Secondary | ICD-10-CM

## 2022-06-17 DIAGNOSIS — Z1152 Encounter for screening for COVID-19: Secondary | ICD-10-CM

## 2022-06-17 LAB — POC COVID19 BINAXNOW: SARS Coronavirus 2 Ag: POSITIVE — AB

## 2022-06-17 MED ORDER — HYDROCODONE BIT-HOMATROP MBR 5-1.5 MG/5ML PO SOLN
5.0000 mL | Freq: Four times a day (QID) | ORAL | 0 refills | Status: AC | PRN
  Filled 2022-06-17: qty 180, 9d supply, fill #0

## 2022-06-17 MED ORDER — ALBUTEROL SULFATE HFA 108 (90 BASE) MCG/ACT IN AERS
2.0000 | INHALATION_SPRAY | Freq: Four times a day (QID) | RESPIRATORY_TRACT | 0 refills | Status: AC | PRN
  Filled 2022-06-17: qty 6.7, 25d supply, fill #0

## 2022-06-17 MED ORDER — NIRMATRELVIR/RITONAVIR (PAXLOVID)TABLET
3.0000 | ORAL_TABLET | Freq: Two times a day (BID) | ORAL | 0 refills | Status: AC
  Filled 2022-06-17: qty 30, 5d supply, fill #0

## 2022-06-17 MED ORDER — PREDNISONE 10 MG PO TABS
ORAL_TABLET | ORAL | 0 refills | Status: AC
  Filled 2022-06-17: qty 18, 9d supply, fill #0

## 2022-06-17 NOTE — Progress Notes (Signed)
Patient ID: Kristen Fleming, female   DOB: [birth year], 37 y.o.   MRN: [id number]        Chief Complaint: follow up cough and wheezing       HPI:  Kristen Fleming is a 37 y.o. female  Here with 2-3 days acute onset fever, facial pain, pressure, headache, general weakness and malaise, with mild ST and cough, but pt denies chest pain, orthopnea, PND, increased LE swelling, palpitations, dizziness or syncope, but has has worsening sob doe mild wheezing x 1 day as well.   Pt denies polydipsia, polyuria, or new focal neuro s/s.    Pt denies fever, wt loss, night sweats, loss of appetite, or other constitutional symptoms     Wt Readings from Last 3 Encounters:  06/17/22 218 lb (98.9 kg)  06/05/22 219 lb (99.3 kg)  05/09/22 225 lb (102.1 kg)   BP Readings from Last 3 Encounters:  06/17/22 130/82  06/05/22 128/88  05/09/22 120/88         Past Medical History:  Diagnosis Date   Bilateral knee pain    Hypertension    Obesity (BMI 30.0-34.9)    History reviewed. No pertinent surgical history.  reports that she has never smoked. She has never used smokeless tobacco. She reports current alcohol use. She reports that she does not use drugs. family history includes Breast cancer (age of onset: 38) in her paternal grandmother; Diabetes in her maternal grandmother; Hypertension in her maternal grandmother. No Known Allergies Current Outpatient Medications on File Prior to Visit  Medication Sig Dispense Refill   hydrochlorothiazide (HYDRODIURIL) 12.5 MG tablet Take 1 tablet by mouth daily.     pantoprazole (PROTONIX) 40 MG tablet Take 1 tablet (40 mg total) by mouth daily. 90 tablet 1   Vitamin D, Ergocalciferol, (DRISDOL) 1.25 MG (50000 UNIT) CAPS capsule Take 1 capsule (50,000 Units total) by mouth every 7 (seven) days for 12 doses. 12 capsule 0   No current facility-administered medications on file prior to visit.        ROS:  All others reviewed and negative.  Objective         PE:  BP 130/82 (BP Location: Left Arm, Patient Position: Sitting, Cuff Size: Normal)   Pulse 84   Temp 98.9 F (37.2 C) (Oral)   Ht 5\' 4"  (1.626 m)   Wt 218 lb (98.9 kg)   SpO2 98%   BMI 37.42 kg/m                 Constitutional: Pt appears in NAD               HENT: Head: NCAT.                Right Ear: External ear normal.                 Left Ear: External ear normal. Bilat tm's with mild erythema.  Max sinus areas non tender.  Pharynx with mild erythema, no exudate               Eyes: . Pupils are equal, round, and reactive to light. Conjunctivae and EOM are normal               Nose: without d/c or deformity               Neck: Neck supple. Gross normal ROM               Cardiovascular: Normal rate  and regular rhythm.                 Pulmonary/Chest: Effort normal and breath sounds decreased without rales but with few mild wheezing.                               Neurological: Pt is alert. At baseline orientation, motor grossly intact               Skin: Skin is warm. No rashes, no other new lesions, LE edema - none               Psychiatric: Pt behavior is normal without agitation   Micro: none  Cardiac tracings I have personally interpreted today:  none  Pertinent Radiological findings (summarize): none   Lab Results  Component Value Date   WBC 5.3 05/09/2022   HGB 13.3 05/09/2022   HCT 40.6 05/09/2022   PLT 266.0 05/09/2022   GLUCOSE 87 05/09/2022   CHOL 173 05/09/2022   TRIG 58.0 05/09/2022   HDL 53.90 05/09/2022   LDLCALC 108 (H) 05/09/2022   ALT 28 05/09/2022   AST 23 05/09/2022   NA 138 05/09/2022   K 4.0 05/09/2022   CL 105 05/09/2022   CREATININE 0.77 05/09/2022   BUN 9 05/09/2022   CO2 26 05/09/2022   TSH 0.99 05/09/2022   HGBA1C 6.0 05/09/2022   POCT - COVID - positive  Assessment/Plan:  Kristen Fleming is a 37 y.o. Black or African American [2] female with  has a past medical history of Bilateral knee pain, Hypertension, and Obesity (BMI  30.0-34.9).  COVID-19 virus infection Mild to mod, for antibx course paxlovid, cough med prn,  to f/u any worsening symptoms or concerns  Hypertension BP Readings from Last 3 Encounters:  06/17/22 130/82  06/05/22 128/88  05/09/22 120/88   Stable, pt to continue medical treatment hct 12.5 qd   Vitamin D deficiency Last vitamin D Lab Results  Component Value Date   VD25OH 16.49 (L) 05/09/2022   Low, to start oral replacement   Wheezing Mild, unusual for covid infection, declines cxr,  for prednisone taper, and inhaler prn,,  to f/u any worsening symptoms or concerns  Followup: Return if symptoms worsen or fail to improve.  Kristen Cower, MD 06/17/2022 7:16 PM [facility]

## 2022-06-17 NOTE — Assessment & Plan Note (Signed)
BP Readings from Last 3 Encounters:  06/17/22 130/82  06/05/22 128/88  05/09/22 120/88   Stable, pt to continue medical treatment hct 12.5 qd

## 2022-06-17 NOTE — Assessment & Plan Note (Signed)
Last vitamin D Lab Results  Component Value Date   VD25OH 16.49 (L) 05/09/2022   Low, to start oral replacement

## 2022-06-17 NOTE — Assessment & Plan Note (Signed)
Mild to mod, for antibx course  - paxlovid, cough med prn,  to f/u any worsening symptoms or concerns °

## 2022-06-17 NOTE — Patient Instructions (Signed)
Your COVID test was Positive today  Please take all new medication as prescribed - the paxlovid antibiotic we discussed, cough medicine as needed, prednisone, and Albuterol HFA as needed as well  Please continue all other medications as before, and refills have been done if requested.  Please have the pharmacy call with any other refills you may need.  Please continue your efforts at being more active, low cholesterol diet, and weight control.  You are otherwise up to date with prevention measures today.  Please keep your appointments with your specialists as you may have planned

## 2022-06-17 NOTE — Assessment & Plan Note (Signed)
Mild, unusual for covid infection, declines cxr,  for prednisone taper, and inhaler prn,,  to f/u any worsening symptoms or concerns

## 2022-07-22 ENCOUNTER — Ambulatory Visit (INDEPENDENT_AMBULATORY_CARE_PROVIDER_SITE_OTHER): Payer: 59

## 2022-07-22 DIAGNOSIS — Z3042 Encounter for surveillance of injectable contraceptive: Secondary | ICD-10-CM | POA: Diagnosis not present

## 2022-07-22 MED ORDER — MEDROXYPROGESTERONE ACETATE 150 MG/ML IM SUSY
150.0000 mg | PREFILLED_SYRINGE | Freq: Once | INTRAMUSCULAR | Status: AC
  Administered 2022-07-22: 150 mg via INTRAMUSCULAR

## 2022-08-19 ENCOUNTER — Ambulatory Visit: Payer: 59

## 2022-08-19 ENCOUNTER — Ambulatory Visit: Payer: 59 | Admitting: Internal Medicine

## 2022-08-19 DIAGNOSIS — R7302 Impaired glucose tolerance (oral): Secondary | ICD-10-CM

## 2022-08-19 DIAGNOSIS — E559 Vitamin D deficiency, unspecified: Secondary | ICD-10-CM

## 2022-08-19 LAB — HEMOGLOBIN A1C: Hgb A1c MFr Bld: 5.8 % (ref 4.6–6.5)

## 2022-08-19 LAB — VITAMIN D 25 HYDROXY (VIT D DEFICIENCY, FRACTURES): VITD: 44.61 ng/mL (ref 30.00–100.00)

## 2022-09-25 ENCOUNTER — Other Ambulatory Visit: Payer: Self-pay | Admitting: Oncology

## 2022-09-25 DIAGNOSIS — Z006 Encounter for examination for normal comparison and control in clinical research program: Secondary | ICD-10-CM

## 2022-10-07 ENCOUNTER — Ambulatory Visit: Payer: 59

## 2022-10-10 ENCOUNTER — Ambulatory Visit (INDEPENDENT_AMBULATORY_CARE_PROVIDER_SITE_OTHER): Payer: 59

## 2022-10-10 ENCOUNTER — Other Ambulatory Visit (HOSPITAL_COMMUNITY)
Admission: RE | Admit: 2022-10-10 | Discharge: 2022-10-10 | Disposition: A | Discharge status: Home / Self Care | Payer: 59 | Source: Other Acute Inpatient Hospital | Attending: Oncology | Admitting: Oncology

## 2022-10-10 DIAGNOSIS — Z3042 Encounter for surveillance of injectable contraceptive: Secondary | ICD-10-CM

## 2022-10-10 DIAGNOSIS — Z006 Encounter for examination for normal comparison and control in clinical research program: Secondary | ICD-10-CM | POA: Insufficient documentation

## 2022-10-10 MED ORDER — MEDROXYPROGESTERONE ACETATE 150 MG/ML IM SUSY
150.0000 mg | PREFILLED_SYRINGE | Freq: Once | INTRAMUSCULAR | Status: AC
Start: 2022-10-10 — End: 2022-10-10
  Administered 2022-10-10: 150 mg via INTRAMUSCULAR

## 2022-12-16 ENCOUNTER — Other Ambulatory Visit (HOSPITAL_COMMUNITY): Payer: Self-pay

## 2022-12-16 MED ORDER — INFLUENZA VIRUS VACC SPLIT PF (FLUZONE) 0.5 ML IM SUSY
0.5000 mL | PREFILLED_SYRINGE | Freq: Once | INTRAMUSCULAR | 0 refills | Status: AC
  Filled 2022-12-16: qty 0.5, 1d supply, fill #0

## 2022-12-27 ENCOUNTER — Other Ambulatory Visit: Payer: Self-pay | Admitting: Internal Medicine

## 2022-12-27 DIAGNOSIS — K219 Gastro-esophageal reflux disease without esophagitis: Secondary | ICD-10-CM

## 2022-12-30 ENCOUNTER — Ambulatory Visit (INDEPENDENT_AMBULATORY_CARE_PROVIDER_SITE_OTHER): Payer: 59 | Admitting: *Deleted

## 2022-12-30 ENCOUNTER — Other Ambulatory Visit (HOSPITAL_COMMUNITY): Payer: Self-pay

## 2022-12-30 DIAGNOSIS — Z3042 Encounter for surveillance of injectable contraceptive: Secondary | ICD-10-CM | POA: Diagnosis not present

## 2022-12-30 MED ORDER — PANTOPRAZOLE SODIUM 40 MG PO TBEC
40.0000 mg | DELAYED_RELEASE_TABLET | Freq: Every day | ORAL | 1 refills | Status: DC
Start: 2022-12-30 — End: 2023-07-08
  Filled 2022-12-30: qty 90, 90d supply, fill #0
  Filled 2023-04-01: qty 90, 90d supply, fill #1

## 2022-12-30 MED ORDER — MEDROXYPROGESTERONE ACETATE 150 MG/ML IM SUSP
150.0000 mg | Freq: Once | INTRAMUSCULAR | Status: AC
Start: 2022-12-30 — End: 2022-12-30
  Administered 2022-12-30: 150 mg via INTRAMUSCULAR

## 2023-03-17 ENCOUNTER — Ambulatory Visit (INDEPENDENT_AMBULATORY_CARE_PROVIDER_SITE_OTHER): Payer: 59

## 2023-03-17 DIAGNOSIS — Z3042 Encounter for surveillance of injectable contraceptive: Secondary | ICD-10-CM | POA: Diagnosis not present

## 2023-03-17 MED ORDER — MEDROXYPROGESTERONE ACETATE 150 MG/ML IM SUSY
150.0000 mg | PREFILLED_SYRINGE | Freq: Once | INTRAMUSCULAR | Status: AC
Start: 2023-03-17 — End: 2023-03-17
  Administered 2023-03-17: 150 mg via INTRAMUSCULAR

## 2023-03-17 NOTE — Progress Notes (Signed)
Patient is in office today for a nurse visit for Birth Control Injection. Patient injection was given in the right upper outer quad of gluteus. Pt tolerated injection well.

## 2023-04-01 ENCOUNTER — Other Ambulatory Visit (HOSPITAL_COMMUNITY): Payer: Self-pay

## 2023-05-07 ENCOUNTER — Ambulatory Visit: Payer: 59 | Admitting: Internal Medicine

## 2023-06-02 ENCOUNTER — Ambulatory Visit (INDEPENDENT_AMBULATORY_CARE_PROVIDER_SITE_OTHER): Payer: 59

## 2023-06-02 DIAGNOSIS — Z3042 Encounter for surveillance of injectable contraceptive: Secondary | ICD-10-CM | POA: Diagnosis not present

## 2023-06-02 MED ORDER — MEDROXYPROGESTERONE ACETATE 150 MG/ML IM SUSY
150.0000 mg | PREFILLED_SYRINGE | Freq: Once | INTRAMUSCULAR | Status: AC
Start: 2023-06-02 — End: 2023-06-02
  Administered 2023-06-02: 150 mg via INTRAMUSCULAR

## 2023-06-09 ENCOUNTER — Encounter: Payer: Self-pay | Admitting: Nurse Practitioner

## 2023-06-09 ENCOUNTER — Ambulatory Visit (INDEPENDENT_AMBULATORY_CARE_PROVIDER_SITE_OTHER): Payer: 59 | Admitting: Nurse Practitioner

## 2023-06-09 VITALS — BP 138/86 | HR 72 | Ht 65.0 in | Wt 219.0 lb

## 2023-06-09 DIAGNOSIS — Z01419 Encounter for gynecological examination (general) (routine) without abnormal findings: Secondary | ICD-10-CM | POA: Diagnosis not present

## 2023-06-09 DIAGNOSIS — Z1331 Encounter for screening for depression: Secondary | ICD-10-CM | POA: Diagnosis not present

## 2023-06-09 DIAGNOSIS — Z3042 Encounter for surveillance of injectable contraceptive: Secondary | ICD-10-CM

## 2023-06-09 NOTE — Progress Notes (Signed)
 Kristen Fleming [birth year] [id number]   History:  38 y.o. G0 presents for annual exam. Depo. Occasionally light spotting. Normal pap history. HTN, GERD managed by PCP.   Gynecologic History No LMP recorded. Patient has had an injection.   Contraception/Family planning: Depo-Provera injections Sexually active: No  Health Maintenance Last Pap: 06/05/2022. Results were: Normal neg  HPV Last mammogram: Not indicated Last colonoscopy: Not indicated Last Dexa: Not indicated     06/09/2023   10:29 AM  Depression screen PHQ 2/9  Decreased Interest 1  Down, Depressed, Hopeless 0  PHQ - 2 Score 1  Altered sleeping 1  Tired, decreased energy 1  Change in appetite 0  Feeling bad or failure about yourself  0  Trouble concentrating 1  Moving slowly or fidgety/restless 1  Suicidal thoughts 0  PHQ-9 Score 5     Past medical history, past surgical history, family history and social history were all reviewed and documented in the EPIC chart. 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   ROS:  A ROS was performed and pertinent positives and negatives are included.  Exam:  Vitals:   06/09/23 1026  BP: 138/86  Pulse: 72  SpO2: 99%  Weight: 219 lb (99.3 kg)  Height: 5\' 5"  (1.651 m)     Body mass index is 36.44 kg/m.  General appearance:  Normal Thyroid:  Symmetrical, normal in size, without palpable masses or nodularity. Respiratory  Auscultation:  Clear without wheezing or rhonchi Cardiovascular  Auscultation:  Regular rate, without rubs, murmurs or gallops  Edema/varicosities:  Not grossly evident Abdominal  Soft,nontender, without masses, guarding or rebound.  Liver/spleen:  No organomegaly noted  Hernia:  None appreciated  Skin  Inspection:  Grossly normal Breasts: Examined lying and sitting.   Right: Without masses, retractions, nipple discharge or axillary adenopathy.   Left: Without masses, retractions, nipple discharge or axillary adenopathy. Pelvic: External genitalia:   no lesions              Urethra:  normal appearing urethra with no masses, tenderness or lesions              Bartholins and Skenes: normal                 Vagina: normal appearing vagina with normal color and discharge, no lesions              Cervix: no lesions Bimanual Exam:  Uterus:  no masses or tenderness              Adnexa: no mass, fullness, tenderness              Rectovaginal: Deferred              Anus:  normal, no lesions  Patient informed chaperone available to be present for breast and pelvic exam. Patient has requested no chaperone to be present. Patient has been advised what will be completed during breast and pelvic exam.   Assessment/Plan:  38 y.o. G0 for annual exam.   Well female exam with routine gynecological exam - Education provided on SBEs, importance of preventative screenings, current guidelines, high calcium diet, regular exercise, and multivitamin daily.  Labs with PCP.  Encounter for surveillance of injectable contraceptive - Doing well on depo. Occasionally light spotting.  Screening for cervical cancer - Normal pap history. Will repeat at 5-year interval per guidelines.   Return in about 1 year (around 06/08/2024) for Annual.    Olivia Mackie DNP, 10:48 AM 06/09/2023

## 2023-06-16 ENCOUNTER — Other Ambulatory Visit (HOSPITAL_COMMUNITY): Payer: Self-pay

## 2023-06-16 ENCOUNTER — Encounter: Payer: Self-pay | Admitting: Internal Medicine

## 2023-06-16 ENCOUNTER — Ambulatory Visit (INDEPENDENT_AMBULATORY_CARE_PROVIDER_SITE_OTHER): Payer: 59 | Admitting: Internal Medicine

## 2023-06-16 ENCOUNTER — Other Ambulatory Visit: Payer: Self-pay | Admitting: Internal Medicine

## 2023-06-16 VITALS — BP 130/84 | HR 62 | Temp 98.3°F | Ht 65.0 in | Wt 221.1 lb

## 2023-06-16 DIAGNOSIS — E669 Obesity, unspecified: Secondary | ICD-10-CM | POA: Diagnosis not present

## 2023-06-16 DIAGNOSIS — E66811 Obesity, class 1: Secondary | ICD-10-CM | POA: Diagnosis not present

## 2023-06-16 DIAGNOSIS — Z0000 Encounter for general adult medical examination without abnormal findings: Secondary | ICD-10-CM | POA: Diagnosis not present

## 2023-06-16 DIAGNOSIS — E559 Vitamin D deficiency, unspecified: Secondary | ICD-10-CM

## 2023-06-16 DIAGNOSIS — R7302 Impaired glucose tolerance (oral): Secondary | ICD-10-CM | POA: Diagnosis not present

## 2023-06-16 DIAGNOSIS — I10 Essential (primary) hypertension: Secondary | ICD-10-CM | POA: Diagnosis not present

## 2023-06-16 LAB — COMPREHENSIVE METABOLIC PANEL WITH GFR
ALT: 17 U/L (ref 0–35)
AST: 15 U/L (ref 0–37)
Albumin: 4 g/dL (ref 3.5–5.2)
Alkaline Phosphatase: 68 U/L (ref 39–117)
BUN: 10 mg/dL (ref 6–23)
CO2: 24 meq/L (ref 19–32)
Calcium: 9.1 mg/dL (ref 8.4–10.5)
Chloride: 105 meq/L (ref 96–112)
Creatinine, Ser: 0.82 mg/dL (ref 0.40–1.20)
GFR: 90.97 mL/min (ref >60.00)
Glucose, Bld: 87 mg/dL (ref 70–99)
Potassium: 3.9 meq/L (ref 3.5–5.1)
Sodium: 136 meq/L (ref 135–145)
Total Bilirubin: 0.4 mg/dL (ref 0.2–1.2)
Total Protein: 7.4 g/dL (ref 6.0–8.3)

## 2023-06-16 LAB — TSH: TSH: 0.61 u[IU]/mL (ref 0.35–5.50)

## 2023-06-16 LAB — LIPID PANEL
Cholesterol: 160 mg/dL (ref 0–200)
HDL: 44.2 mg/dL (ref >39.00)
LDL Cholesterol: 108 mg/dL — ABNORMAL HIGH (ref 0–99)
NonHDL: 115.99
Total CHOL/HDL Ratio: 4
Triglycerides: 41 mg/dL (ref 0.0–149.0)
VLDL: 8.2 mg/dL (ref 0.0–40.0)

## 2023-06-16 LAB — CBC WITH DIFFERENTIAL/PLATELET
Basophils Absolute: 0 10*3/uL (ref 0.0–0.1)
Basophils Relative: 0.7 % (ref 0.0–3.0)
Eosinophils Absolute: 0.2 10*3/uL (ref 0.0–0.7)
Eosinophils Relative: 4 % (ref 0.0–5.0)
HCT: 39.4 % (ref 36.0–46.0)
Hemoglobin: 13.1 g/dL (ref 12.0–15.0)
Lymphocytes Relative: 39.9 % (ref 12.0–46.0)
Lymphs Abs: 1.8 10*3/uL (ref 0.7–4.0)
MCHC: 33.2 g/dL (ref 30.0–36.0)
MCV: 85 fl (ref 78.0–100.0)
Monocytes Absolute: 0.3 10*3/uL (ref 0.1–1.0)
Monocytes Relative: 7 % (ref 3.0–12.0)
Neutro Abs: 2.1 10*3/uL (ref 1.4–7.7)
Neutrophils Relative %: 48.4 % (ref 43.0–77.0)
Platelets: 249 10*3/uL (ref 150.0–400.0)
RBC: 4.64 Mil/uL (ref 3.87–5.11)
RDW: 14.4 % (ref 11.5–15.5)
WBC: 4.4 10*3/uL (ref 4.0–10.5)

## 2023-06-16 LAB — HEMOGLOBIN A1C: Hgb A1c MFr Bld: 5.8 % (ref 4.6–6.5)

## 2023-06-16 LAB — VITAMIN B12: Vitamin B-12: 652 pg/mL (ref 211–911)

## 2023-06-16 LAB — VITAMIN D 25 HYDROXY (VIT D DEFICIENCY, FRACTURES): VITD: 25.67 ng/mL — ABNORMAL LOW (ref 30.00–100.00)

## 2023-06-16 MED ORDER — AMLODIPINE BESYLATE 5 MG PO TABS
5.0000 mg | ORAL_TABLET | Freq: Every day | ORAL | 1 refills | Status: DC
  Filled 2023-06-16: qty 90, 90d supply, fill #0
  Filled 2023-09-15: qty 90, 90d supply, fill #1

## 2023-06-16 MED ORDER — VITAMIN D (ERGOCALCIFEROL) 1.25 MG (50000 UNIT) PO CAPS
50000.0000 [IU] | ORAL_CAPSULE | ORAL | 0 refills | Status: AC
  Filled 2023-06-16: qty 12, 84d supply, fill #0

## 2023-06-16 NOTE — Progress Notes (Signed)
 Established Patient Office Visit     CC/Reason for Visit: Annual preventive exam and follow-up chronic conditions  HPI: Kristen Fleming is a 38 y.o. female who is coming in today for the above mentioned reasons. Past Medical History is significant for: Hypertension not currently on medication, impaired glucose tolerance, obesity, GERD.  She states ambulatory blood pressure measurements have been in the systolics of 130s with diastolics in the mid to upper 80s.  She has been feeling well.  She has routine eye and dental care.   Past Medical/Surgical History: Past Medical History:  Diagnosis Date   Bilateral knee pain    Hypertension    Obesity (BMI 30.0-34.9)     History reviewed. No pertinent surgical history.  Social History:  reports that she has never smoked. She has never used smokeless tobacco. She reports current alcohol use. She reports that she does not use drugs.  Allergies: No Known Allergies  Family History:  Family History  Problem Relation Age of Onset   Hypertension Maternal Grandmother    Diabetes Maternal Grandmother    Breast cancer Paternal Grandmother 14     Current Outpatient Medications:    albuterol (VENTOLIN HFA) 108 (90 Base) MCG/ACT inhaler, Inhale 2 puffs into the lungs every 6 (six) hours as needed for wheezing or shortness of breath., Disp: 6.7 g, Rfl: 0   amLODipine (NORVASC) 5 MG tablet, Take 1 tablet (5 mg total) by mouth daily., Disp: 90 tablet, Rfl: 1   hydrochlorothiazide (HYDRODIURIL) 12.5 MG tablet, Take 1 tablet by mouth daily., Disp: , Rfl:    pantoprazole (PROTONIX) 40 MG tablet, Take 1 tablet (40 mg total) by mouth daily., Disp: 90 tablet, Rfl: 1  Review of Systems:  Negative unless indicated in HPI.   Physical Exam: Vitals:   06/16/23 0833  BP: 130/84  Pulse: 62  Temp: 98.3 F (36.8 C)  TempSrc: Oral  SpO2: 100%  Weight: 221 lb 1.6 oz (100.3 kg)  Height: 5\' 5"  (1.651 m)    Body mass index is 36.79  kg/m.   Physical Exam Vitals reviewed.  Constitutional:      General: She is not in acute distress.    Appearance: Normal appearance. She is not ill-appearing, toxic-appearing or diaphoretic.  HENT:     Head: Normocephalic.     Right Ear: Tympanic membrane, ear canal and external ear normal. There is no impacted cerumen.     Left Ear: Tympanic membrane, ear canal and external ear normal. There is no impacted cerumen.     Nose: Nose normal.     Mouth/Throat:     Mouth: Mucous membranes are moist.     Pharynx: Oropharynx is clear. No oropharyngeal exudate or posterior oropharyngeal erythema.  Eyes:     General: No scleral icterus.       Right eye: No discharge.        Left eye: No discharge.     Conjunctiva/sclera: Conjunctivae normal.     Pupils: Pupils are equal, round, and reactive to light.  Neck:     Vascular: No carotid bruit.  Cardiovascular:     Rate and Rhythm: Normal rate and regular rhythm.     Pulses: Normal pulses.     Heart sounds: Normal heart sounds.  Pulmonary:     Effort: Pulmonary effort is normal. No respiratory distress.     Breath sounds: Normal breath sounds.  Abdominal:     General: Abdomen is flat. Bowel sounds are normal.  Palpations: Abdomen is soft.  Musculoskeletal:        General: Normal range of motion.     Cervical back: Normal range of motion.  Skin:    General: Skin is warm and dry.  Neurological:     General: No focal deficit present.     Mental Status: She is alert and oriented to person, place, and time. Mental status is at baseline.  Psychiatric:        Mood and Affect: Mood normal.        Behavior: Behavior normal.        Thought Content: Thought content normal.        Judgment: Judgment normal.     Flowsheet Row Office Visit from 06/16/2023 in [facility]  PHQ-9 Total Score 0        Impression and Plan:  Encounter for preventive health examination  IGT (impaired glucose tolerance) -      Hemoglobin A1c; Future  Vitamin D deficiency -     VITAMIN D 25 Hydroxy (Vit-D Deficiency, Fractures); Future  Primary hypertension -     CBC with Differential/Platelet; Future -     Comprehensive metabolic panel with GFR; Future -     amLODIPine Besylate; Take 1 tablet (5 mg total) by mouth daily.  Dispense: 90 tablet; Refill: 1  Obesity (BMI 35.0-39.9 without comorbidity) -     Lipid panel; Future -     TSH; Future -     Vitamin B12; Future  Obesity (BMI 30.0-34.9)   -Recommend routine eye and dental care. -Healthy lifestyle discussed in detail. -Labs to be updated today. -Prostate cancer screening: N/A Health Maintenance  Topic Date Due   COVID-19 Vaccine (4 - 2024-25 season) 11/17/2022   Pap with HPV screening  06/05/2027   DTaP/Tdap/Td vaccine (3 - Td or Tdap) 03/18/2029   Flu Shot  Completed   Hepatitis C Screening  Completed   HIV Screening  Completed   HPV Vaccine  Aged Out    -All immunizations and cancer screenings are up-to-date. -I will go ahead and start amlodipine 5 mg daily she will return in 2 to 3 months for follow-up. -GERD is well-controlled on pantoprazole.     Chaya Jan, MD [facility]

## 2023-07-08 ENCOUNTER — Other Ambulatory Visit (HOSPITAL_COMMUNITY): Payer: Self-pay

## 2023-07-08 ENCOUNTER — Other Ambulatory Visit: Payer: Self-pay | Admitting: Internal Medicine

## 2023-07-08 ENCOUNTER — Other Ambulatory Visit: Payer: Self-pay

## 2023-07-08 DIAGNOSIS — K219 Gastro-esophageal reflux disease without esophagitis: Secondary | ICD-10-CM

## 2023-07-08 MED ORDER — PANTOPRAZOLE SODIUM 40 MG PO TBEC
40.0000 mg | DELAYED_RELEASE_TABLET | Freq: Every day | ORAL | 1 refills | Status: DC
  Filled 2023-07-08: qty 90, 90d supply, fill #0
  Filled 2023-10-02: qty 90, 90d supply, fill #1

## 2023-08-18 ENCOUNTER — Ambulatory Visit (INDEPENDENT_AMBULATORY_CARE_PROVIDER_SITE_OTHER)

## 2023-08-18 DIAGNOSIS — Z3042 Encounter for surveillance of injectable contraceptive: Secondary | ICD-10-CM | POA: Diagnosis not present

## 2023-08-18 DIAGNOSIS — C801 Malignant (primary) neoplasm, unspecified: Secondary | ICD-10-CM

## 2023-08-18 MED ORDER — MEDROXYPROGESTERONE ACETATE 150 MG/ML IM SUSP
150.0000 mg | Freq: Once | INTRAMUSCULAR | Status: AC
Start: 2023-08-18 — End: 2023-08-18
  Administered 2023-08-18: 150 mg via INTRAMUSCULAR

## 2023-09-15 ENCOUNTER — Ambulatory Visit: Admitting: Internal Medicine

## 2023-09-15 ENCOUNTER — Ambulatory Visit: Payer: Self-pay | Admitting: Internal Medicine

## 2023-09-15 ENCOUNTER — Encounter: Payer: Self-pay | Admitting: Internal Medicine

## 2023-09-15 VITALS — BP 120/80 | HR 70 | Temp 98.2°F | Wt 222.6 lb

## 2023-09-15 DIAGNOSIS — E559 Vitamin D deficiency, unspecified: Secondary | ICD-10-CM | POA: Diagnosis not present

## 2023-09-15 DIAGNOSIS — I10 Essential (primary) hypertension: Secondary | ICD-10-CM | POA: Diagnosis not present

## 2023-09-15 DIAGNOSIS — R7302 Impaired glucose tolerance (oral): Secondary | ICD-10-CM | POA: Diagnosis not present

## 2023-09-15 LAB — POCT GLYCOSYLATED HEMOGLOBIN (HGB A1C): Hemoglobin A1C: 5.7 % — AB (ref 4.0–5.6)

## 2023-09-15 LAB — VITAMIN D 25 HYDROXY (VIT D DEFICIENCY, FRACTURES): VITD: 44.72 ng/mL (ref 30.00–100.00)

## 2023-09-15 NOTE — Progress Notes (Signed)
     Established Patient Office Visit     CC/Reason for Visit: Follow-up chronic conditions  HPI: Kristen Fleming is a 38 y.o. female who is coming in today for the above mentioned reasons. Past Medical History is significant for: Hypertension, impaired glucose tolerance, vitamin D  deficiency, obesity.  Last visit she was started on amlodipine  5 mg and vitamin D  supplementation.  Here for follow-up.  She is tolerating medication well.   Past Medical/Surgical History: Past Medical History:  Diagnosis Date   Bilateral knee pain    Hypertension    Obesity (BMI 30.0-34.9)     History reviewed. No pertinent surgical history.  Social History:  reports that she has never smoked. She has never used smokeless tobacco. She reports current alcohol use. She reports that she does not use drugs.  Allergies: No Known Allergies  Family History:  Family History  Problem Relation Age of Onset   Hypertension Maternal Grandmother    Diabetes Maternal Grandmother    Breast cancer Paternal Grandmother 18     Current Outpatient Medications:    albuterol  (VENTOLIN  HFA) 108 (90 Base) MCG/ACT inhaler, Inhale 2 puffs into the lungs every 6 (six) hours as needed for wheezing or shortness of breath., Disp: 6.7 g, Rfl: 0   amLODipine  (NORVASC ) 5 MG tablet, Take 1 tablet (5 mg total) by mouth daily., Disp: 90 tablet, Rfl: 1   pantoprazole  (PROTONIX ) 40 MG tablet, Take 1 tablet (40 mg total) by mouth daily., Disp: 90 tablet, Rfl: 1  Review of Systems:  Negative unless indicated in HPI.   Physical Exam: Vitals:   09/15/23 1121  BP: 120/80  Pulse: 70  Temp: 98.2 F (36.8 C)  TempSrc: Oral  SpO2: 99%  Weight: 222 lb 9.6 oz (101 kg)    Body mass index is 37.04 kg/m.   Physical Exam Vitals reviewed.  Constitutional:      Appearance: Normal appearance.  HENT:     Head: Normocephalic and atraumatic.   Eyes:     Conjunctiva/sclera: Conjunctivae normal.     Pupils: Pupils are  equal, round, and reactive to light.    Cardiovascular:     Rate and Rhythm: Normal rate and regular rhythm.  Pulmonary:     Effort: Pulmonary effort is normal.     Breath sounds: Normal breath sounds.   Skin:    General: Skin is warm and dry.   Neurological:     General: No focal deficit present.     Mental Status: She is alert and oriented to person, place, and time.   Psychiatric:        Mood and Affect: Mood normal.        Behavior: Behavior normal.        Thought Content: Thought content normal.        Judgment: Judgment normal.      Impression and Plan:  IGT (impaired glucose tolerance) -     POCT glycosylated hemoglobin (Hb A1C)  Primary hypertension  Vitamin D  deficiency -     VITAMIN D  25 Hydroxy (Vit-D Deficiency, Fractures); Future   - Blood pressure is improved, continue amlodipine . - A1c is stable in the prediabetic range at 5.7. - Completed 12 weeks of high-dose vitamin D  supplementation, recheck levels today.  Time spent:30 minutes reviewing chart, interviewing and examining patient and formulating plan of care.     Tully Theophilus Andrews, MD [facility]

## 2023-09-23 ENCOUNTER — Other Ambulatory Visit (HOSPITAL_COMMUNITY): Payer: Self-pay

## 2023-11-03 ENCOUNTER — Ambulatory Visit (INDEPENDENT_AMBULATORY_CARE_PROVIDER_SITE_OTHER)

## 2023-11-03 DIAGNOSIS — Z3042 Encounter for surveillance of injectable contraceptive: Secondary | ICD-10-CM | POA: Diagnosis not present

## 2023-11-03 MED ORDER — MEDROXYPROGESTERONE ACETATE 150 MG/ML IM SUSP
150.0000 mg | Freq: Once | INTRAMUSCULAR | Status: AC
  Administered 2023-11-03: 150 mg via INTRAMUSCULAR

## 2023-12-15 ENCOUNTER — Other Ambulatory Visit (HOSPITAL_COMMUNITY): Payer: Self-pay

## 2023-12-15 ENCOUNTER — Other Ambulatory Visit: Payer: Self-pay | Admitting: Internal Medicine

## 2023-12-15 ENCOUNTER — Other Ambulatory Visit: Payer: Self-pay

## 2023-12-15 DIAGNOSIS — I10 Essential (primary) hypertension: Secondary | ICD-10-CM

## 2023-12-15 MED ORDER — AMLODIPINE BESYLATE 5 MG PO TABS
5.0000 mg | ORAL_TABLET | Freq: Every day | ORAL | 1 refills | Status: AC
  Filled 2023-12-15: qty 90, 90d supply, fill #0
  Filled 2024-03-22: qty 90, 90d supply, fill #1

## 2023-12-15 MED ORDER — FLUZONE 0.5 ML IM SUSY
0.5000 mL | PREFILLED_SYRINGE | Freq: Once | INTRAMUSCULAR | 0 refills | Status: AC
Start: 2023-12-15 — End: 2023-12-16
  Filled 2023-12-15: qty 0.5, 1d supply, fill #0

## 2023-12-15 MED ORDER — COVID-19 MRNA VAC-TRIS(PFIZER) 30 MCG/0.3ML IM SUSY
0.3000 mL | PREFILLED_SYRINGE | Freq: Once | INTRAMUSCULAR | 0 refills | Status: AC
  Filled 2023-12-15: qty 0.3, 1d supply, fill #0

## 2023-12-29 ENCOUNTER — Other Ambulatory Visit: Payer: Self-pay | Admitting: Internal Medicine

## 2023-12-29 DIAGNOSIS — K219 Gastro-esophageal reflux disease without esophagitis: Secondary | ICD-10-CM

## 2023-12-30 ENCOUNTER — Other Ambulatory Visit (HOSPITAL_COMMUNITY): Payer: Self-pay

## 2023-12-30 MED ORDER — PANTOPRAZOLE SODIUM 40 MG PO TBEC
40.0000 mg | DELAYED_RELEASE_TABLET | Freq: Every day | ORAL | 1 refills | Status: AC
  Filled 2023-12-30: qty 90, 90d supply, fill #0
  Filled 2024-03-22: qty 90, 90d supply, fill #1

## 2023-12-31 ENCOUNTER — Encounter: Payer: Self-pay | Admitting: Pharmacist

## 2023-12-31 ENCOUNTER — Other Ambulatory Visit: Payer: Self-pay

## 2023-12-31 ENCOUNTER — Other Ambulatory Visit (HOSPITAL_COMMUNITY): Payer: Self-pay

## 2024-01-19 ENCOUNTER — Ambulatory Visit

## 2024-01-19 DIAGNOSIS — Z3042 Encounter for surveillance of injectable contraceptive: Secondary | ICD-10-CM | POA: Insufficient documentation

## 2024-01-19 MED ORDER — MEDROXYPROGESTERONE ACETATE 150 MG/ML IM SUSP
150.0000 mg | Freq: Once | INTRAMUSCULAR | Status: AC
  Administered 2024-01-19: 150 mg via INTRAMUSCULAR

## 2024-01-19 NOTE — Progress Notes (Signed)
 01/19/24 - Depo Provera  injection given (IM) in the RUO quadrant.  (IC, CCMA).  Pt may return Jan 19 - Apr 19, 2024.

## 2024-03-16 ENCOUNTER — Other Ambulatory Visit (HOSPITAL_COMMUNITY): Payer: Self-pay

## 2024-03-22 ENCOUNTER — Other Ambulatory Visit (HOSPITAL_COMMUNITY): Payer: Self-pay

## 2024-04-05 ENCOUNTER — Ambulatory Visit (INDEPENDENT_AMBULATORY_CARE_PROVIDER_SITE_OTHER)

## 2024-04-05 DIAGNOSIS — Z3042 Encounter for surveillance of injectable contraceptive: Secondary | ICD-10-CM

## 2024-04-05 MED ORDER — MEDROXYPROGESTERONE ACETATE 150 MG/ML IM SUSY
150.0000 mg | PREFILLED_SYRINGE | Freq: Once | INTRAMUSCULAR | Status: AC
  Administered 2024-04-05: 150 mg via INTRAMUSCULAR

## 2024-06-21 ENCOUNTER — Ambulatory Visit

## 2024-07-06 ENCOUNTER — Ambulatory Visit: Admitting: Nurse Practitioner
